# Patient Record
Sex: Male | Born: 1995
Health system: Southern US, Community
[De-identification: ages and names within clinical notes are randomized; demographics above are authoritative.]

## PROBLEM LIST (undated history)

## (undated) DIAGNOSIS — J189 Pneumonia, unspecified organism: Secondary | ICD-10-CM

## (undated) DIAGNOSIS — R519 Headache, unspecified: Secondary | ICD-10-CM

## (undated) DIAGNOSIS — S46011D Strain of muscle(s) and tendon(s) of the rotator cuff of right shoulder, subsequent encounter: Secondary | ICD-10-CM

## (undated) DIAGNOSIS — T8859XA Other complications of anesthesia, initial encounter: Secondary | ICD-10-CM

## (undated) HISTORY — PX: HAND SURGERY: SHX662

## (undated) HISTORY — PX: OTHER SURGICAL HISTORY: SHX169

---
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
## 2015-05-21 ENCOUNTER — Emergency Department (HOSPITAL_BASED_OUTPATIENT_CLINIC_OR_DEPARTMENT_OTHER)
Admission: EM | Admit: 2015-05-21 | Discharge: 2015-05-21 | Disposition: A | Discharge status: Home / Self Care | Payer: Medicaid Other | Attending: Emergency Medicine | Admitting: Emergency Medicine

## 2015-05-21 ENCOUNTER — Encounter (HOSPITAL_BASED_OUTPATIENT_CLINIC_OR_DEPARTMENT_OTHER): Payer: Self-pay | Admitting: Emergency Medicine

## 2015-05-21 DIAGNOSIS — Z87891 Personal history of nicotine dependence: Secondary | ICD-10-CM | POA: Diagnosis not present

## 2015-05-21 DIAGNOSIS — L0201 Cutaneous abscess of face: Secondary | ICD-10-CM

## 2015-05-21 MED ORDER — SULFAMETHOXAZOLE-TRIMETHOPRIM 800-160 MG PO TABS: 1.0000 | ORAL_TABLET | Freq: Two times a day (BID) | ORAL | Status: DC

## 2015-05-21 MED ORDER — IBUPROFEN 400 MG PO TABS
400.0000 mg | ORAL_TABLET | Freq: Once | ORAL | Status: AC
  Administered 2015-05-21: 400 mg via ORAL
  Filled 2015-05-21: qty 1

## 2015-05-21 MED ORDER — SULFAMETHOXAZOLE-TRIMETHOPRIM 800-160 MG PO TABS
1.0000 | ORAL_TABLET | Freq: Once | ORAL | Status: AC
  Administered 2015-05-21: 1 via ORAL
  Filled 2015-05-21: qty 1

## 2015-05-21 NOTE — ED Notes (Signed)
Pt in with abscess noted to R face lateral to eye. Closed, red, and raised.

## 2015-05-21 NOTE — Discharge Instructions (Signed)
For pain control please take ibuprofen (also known as Motrin or Advil) 800mg  (this is normally 4 over the counter pills) 3 times a day  for 5 days. Take with food to minimize stomach irritation.  Apply warm compresses to the area as frequently as you can minimally 4 times per day. Do not scratch at the area. If the swelling worsens or travels towards the eye or you have any change in your vision return immediately to the emergency room. If not please follow-up with your primary care doctor in 1 week for a checkup.   Abscess An abscess is an infected area that contains a collection of pus and debris.It can occur in almost any part of the body. An abscess is also known as a furuncle or boil. CAUSES  An abscess occurs when tissue gets infected. This can occur from blockage of oil or sweat glands, infection of hair follicles, or a minor injury to the skin. As the body tries to fight the infection, pus collects in the area and creates pressure under the skin. This pressure causes pain. People with weakened immune systems have difficulty fighting infections and get certain abscesses more often.  SYMPTOMS Usually an abscess develops on the skin and becomes a painful mass that is red, warm, and tender. If the abscess forms under the skin, you may feel a moveable soft area under the skin. Some abscesses break open (rupture) on their own, but most will continue to get worse without care. The infection can spread deeper into the body and eventually into the bloodstream, causing you to feel ill.  DIAGNOSIS  Your caregiver will take your medical history and perform a physical exam. A sample of fluid may also be taken from the abscess to determine what is causing your infection. TREATMENT  Your caregiver may prescribe antibiotic medicines to fight the infection. However, taking antibiotics alone usually does not cure an abscess. Your caregiver may need to make a small cut (incision) in the abscess to drain the pus.  In some cases, gauze is packed into the abscess to reduce pain and to continue draining the area. HOME CARE INSTRUCTIONS   Only take over-the-counter or prescription medicines for pain, discomfort, or fever as directed by your caregiver.  If you were prescribed antibiotics, take them as directed. Finish them even if you start to feel better.  If gauze is used, follow your caregiver's directions for changing the gauze.  To avoid spreading the infection:  Keep your draining abscess covered with a bandage.  Wash your hands well.  Do not share personal care items, towels, or whirlpools with others.  Avoid skin contact with others.  Keep your skin and clothes clean around the abscess.  Keep all follow-up appointments as directed by your caregiver. SEEK MEDICAL CARE IF:   You have increased pain, swelling, redness, fluid drainage, or bleeding.  You have muscle aches, chills, or a general ill feeling.  You have a fever. MAKE SURE YOU:   Understand these instructions.  Will watch your condition.  Will get help right away if you are not doing well or get worse. Document Released: 08/07/2005 Document Revised: 04/28/2012 Document Reviewed: 01/10/2012 [facility] Patient Information 2015 [place], [place]. This information is not intended to replace advice given to you by your health care provider. Make sure you discuss any questions you have with your health care provider.

## 2015-05-21 NOTE — ED Provider Notes (Signed)
CSN: [id number]     Arrival date & time 05/21/15  2150 History   First MD Initiated Contact with Patient 05/21/15 2219     Chief Complaint  Patient presents with  . Abscess     (Consider location/radiation/quality/duration/timing/severity/associated sxs/prior Treatment) HPI   Blood pressure 138/85, pulse 91, temperature 97.9 F (36.6 C), temperature source Oral, resp. rate 18, height 6\' 6"  (1.981 m), weight 270 lb (122.471 kg), SpO2 97 %.  Nicholas Jensen is a 19 y.o. male complaining of painful swelling to right cheek worsening over the course of the day. Patient initially had a pimple to her yesterday, he picked at it became significantly larger today. Patient denies any fever, nausea, vomiting, change in vision, pain with eye movement.  History reviewed. No pertinent past medical history. Past Surgical History  Procedure Laterality Date  . Hand surgery      right   History reviewed. No pertinent family history. History  Substance Use Topics  . Smoking status: Former Games developermoker  . Smokeless tobacco: Not on file  . Alcohol Use: No    Review of Systems  10 systems reviewed and found to be negative, except as noted in the HPI.   Allergies  Review of patient's allergies indicates no known allergies.  Home Medications   Prior to Admission medications   Medication Sig Start Date End Date Taking? Authorizing Provider  ibuprofen (ADVIL,MOTRIN) 200 MG tablet Take 200 mg by mouth every 6 (six) hours as needed. For headache    Historical Provider, MD  sulfamethoxazole-trimethoprim (BACTRIM DS) 800-160 MG per tablet Take 1 tablet by mouth 2 (two) times daily. 05/21/15   Yuko Coventry, PA-C   BP 138/85 mmHg  Pulse 91  Temp(Src) 97.9 F (36.6 C) (Oral)  Resp 18  Ht 6\' 6"  (1.981 m)  Wt 270 lb (122.471 kg)  BMI 31.21 kg/m2  SpO2 97% Physical Exam  Constitutional: He is oriented to person, place, and time. He appears well-developed and well-nourished. No distress.  HENT:  Head:  Normocephalic.    Eyes: Conjunctivae and EOM are normal. Pupils are equal, round, and reactive to light.  Extraocular movement is intact without pain or diplopia  Cardiovascular: Normal rate.   Pulmonary/Chest: Effort normal. No stridor.  Musculoskeletal: Normal range of motion.  Neurological: He is alert and oriented to person, place, and time.  Skin: Rash noted.  Psychiatric: He has a normal mood and affect.  Nursing note and vitals reviewed.   ED Course  Procedures (including critical care time) Labs Review Labs Reviewed - No data to display  Imaging Review No results found.   EKG Interpretation None      MDM   Final diagnoses:  Facial abscess    Filed Vitals:   05/21/15 2155  BP: 138/85  Pulse: 91  Temp: 97.9 F (36.6 C)  TempSrc: Oral  Resp: 18  Height: 6\' 6"  (1.981 m)  Weight: 270 lb (122.471 kg)  SpO2: 97%    Medications  sulfamethoxazole-trimethoprim (BACTRIM DS,SEPTRA DS) 800-160 MG per tablet 1 tablet (not administered)  ibuprofen (ADVIL,MOTRIN) tablet 400 mg (not administered)    Nicholas Jensen is a pleasant 19 y.o. male presenting with facial abscess. Do not want to I and D this secondary to the scar will leave. I will start the patient on Bactrim and have advised him to perform frequent warm compresses.  Evaluation does not show pathology that would require ongoing emergent intervention or inpatient treatment. Pt is hemodynamically stable and mentating appropriately.  Discussed findings and plan with patient/guardian, who agrees with care plan. All questions answered. Return precautions discussed and outpatient follow up given.   New Prescriptions   SULFAMETHOXAZOLE-TRIMETHOPRIM (BACTRIM DS) 800-160 MG PER TABLET    Take 1 tablet by mouth 2 (two) times daily.         Wynetta Emery, PA-C 05/21/15 2230  Margarita Grizzle, MD 05/21/15 2251

## 2015-07-30 ENCOUNTER — Emergency Department (HOSPITAL_BASED_OUTPATIENT_CLINIC_OR_DEPARTMENT_OTHER)
Admission: EM | Admit: 2015-07-30 | Discharge: 2015-07-30 | Disposition: A | Discharge status: Home / Self Care | Payer: Medicaid Other | Attending: Emergency Medicine | Admitting: Emergency Medicine

## 2015-07-30 ENCOUNTER — Encounter (HOSPITAL_BASED_OUTPATIENT_CLINIC_OR_DEPARTMENT_OTHER): Payer: Self-pay | Admitting: *Deleted

## 2015-07-30 DIAGNOSIS — L02411 Cutaneous abscess of right axilla: Secondary | ICD-10-CM | POA: Diagnosis present

## 2015-07-30 DIAGNOSIS — R202 Paresthesia of skin: Secondary | ICD-10-CM | POA: Insufficient documentation

## 2015-07-30 DIAGNOSIS — Z87891 Personal history of nicotine dependence: Secondary | ICD-10-CM | POA: Insufficient documentation

## 2015-07-30 DIAGNOSIS — Z792 Long term (current) use of antibiotics: Secondary | ICD-10-CM | POA: Insufficient documentation

## 2015-07-30 MED ORDER — LIDOCAINE-EPINEPHRINE 2 %-1:100000 IJ SOLN
20.0000 mL | Freq: Once | INTRAMUSCULAR | Status: AC
  Administered 2015-07-30: 15 mL
  Filled 2015-07-30: qty 1

## 2015-07-30 MED ORDER — DOXYCYCLINE HYCLATE 100 MG PO TABS
100.0000 mg | ORAL_TABLET | Freq: Once | ORAL | Status: AC
  Administered 2015-07-30: 100 mg via ORAL
  Filled 2015-07-30: qty 1

## 2015-07-30 MED ORDER — HYDROCODONE-ACETAMINOPHEN 5-325 MG PO TABS
1.0000 | ORAL_TABLET | Freq: Once | ORAL | Status: AC
  Administered 2015-07-30: 1 via ORAL
  Filled 2015-07-30: qty 1

## 2015-07-30 MED ORDER — HYDROCODONE-ACETAMINOPHEN 5-325 MG PO TABS: 1.0000 | ORAL_TABLET | Freq: Four times a day (QID) | ORAL | Status: DC | PRN

## 2015-07-30 MED ORDER — DOXYCYCLINE HYCLATE 100 MG PO CAPS: 100.0000 mg | ORAL_CAPSULE | Freq: Two times a day (BID) | ORAL | Status: DC

## 2015-07-30 NOTE — ED Notes (Signed)
Pt c/o of abscess to right axillary area for one week. States area was draining earlier this week, but now states area is hard to touch. Denies any fevers. C/o tingling to right arm at times this week.

## 2015-07-30 NOTE — Discharge Instructions (Signed)

## 2015-07-30 NOTE — ED Provider Notes (Addendum)
CSN: [id number]     Arrival date & time 07/30/15  0135 History   First MD Initiated Contact with Patient 07/30/15 0355     Chief Complaint  Patient presents with  . Abscess     (Consider location/radiation/quality/duration/timing/severity/associated sxs/prior Treatment) HPI  This is a 19 year old male with a tender swollen mass in his right axilla for the past week. It was draining purulent material earlier in the week but has stopped. It is increasing in size and there is moderate to severe pain associated with it, worse with palpation or movement. He denies any fever or chills. He has had some tingling in his right arm at times but none presently.  History reviewed. No pertinent past medical history. Past Surgical History  Procedure Laterality Date  . Hand surgery      right  . Hand surgery     No family history on file. Social History  Substance Use Topics  . Smoking status: Former Games developer  . Smokeless tobacco: None  . Alcohol Use: No    Review of Systems  All other systems reviewed and are negative.   Allergies  Review of patient's allergies indicates no known allergies.  Home Medications   Prior to Admission medications   Medication Sig Start Date End Date Taking? Authorizing Provider  ibuprofen (ADVIL,MOTRIN) 200 MG tablet Take 200 mg by mouth every 6 (six) hours as needed. For headache    Historical Provider, MD  sulfamethoxazole-trimethoprim (BACTRIM DS) 800-160 MG per tablet Take 1 tablet by mouth 2 (two) times daily. 05/21/15   Nicole Pisciotta, PA-C   BP 145/77 mmHg  Pulse 102  Temp(Src) 98.6 F (37 C)  Resp 20  Ht  (1.981 m)  Wt 260 lb (117.935 kg)  BMI 30.05 kg/m2  SpO2 99%   Physical Exam  General: Well-developed, well-nourished male in no acute distress; appearance consistent with age of record HENT: normocephalic; atraumatic Eyes: Normal appearance Neck: supple Heart: regular rate and rhythm Lungs: Normal respiratory effort and  excursion Abdomen: soft; nondistended Extremities: No deformity; full range of motion Neurologic: Awake, alert and oriented; motor function intact in all extremities and symmetric; no facial droop Skin: Warm and dry Psychiatric: Normal mood and affect    ED Course  Procedures (including critical care time)  INCISION AND DRAINAGE Performed by: Paula Libra L Consent: Verbal consent obtained. Risks and benefits: risks, benefits and alternatives were discussed Type: abscess  Body area: Right axilla  Anesthesia: local infiltration  Incision was made with a scalpel.  Local anesthetic: lidocaine 2 % with epinephrine  Anesthetic total: 3 ml  Complexity: complex Blunt dissection to break up loculations  Drainage: purulent  Drainage amount: Moderate   Packing material: 1/4 in iodoform gauze  Patient tolerance: Patient tolerated the procedure well with no immediate complications.     MDM  We will place on antibiotics due to surrounding cellulitis.   Paula Libra, MD 07/30/15 4098  Paula Libra, MD 07/30/15 [phone]

## 2015-07-31 ENCOUNTER — Emergency Department (HOSPITAL_BASED_OUTPATIENT_CLINIC_OR_DEPARTMENT_OTHER)
Admission: EM | Admit: 2015-07-31 | Discharge: 2015-07-31 | Disposition: A | Discharge status: Home / Self Care | Payer: Medicaid Other | Attending: Emergency Medicine | Admitting: Emergency Medicine

## 2015-07-31 ENCOUNTER — Encounter (HOSPITAL_BASED_OUTPATIENT_CLINIC_OR_DEPARTMENT_OTHER): Payer: Self-pay

## 2015-07-31 DIAGNOSIS — Z4800 Encounter for change or removal of nonsurgical wound dressing: Secondary | ICD-10-CM

## 2015-07-31 DIAGNOSIS — Z4801 Encounter for change or removal of surgical wound dressing: Secondary | ICD-10-CM | POA: Diagnosis present

## 2015-07-31 DIAGNOSIS — Z87891 Personal history of nicotine dependence: Secondary | ICD-10-CM | POA: Insufficient documentation

## 2015-07-31 NOTE — ED Notes (Signed)
I&D 2 days ago-here for packing removal

## 2015-07-31 NOTE — Discharge Instructions (Signed)
Dressing Change A dressing is a material placed over wounds. It keeps the wound clean, dry, and protected from further injury. This provides an environment that favors wound healing.  BEFORE YOU BEGIN  Get your supplies together. Things you may need include:  Saline solution.  Flexible gauze dressing.  Medicated cream.  Tape.  Gloves.  Abdominal dressing pads.  Gauze squares.  Plastic bags.  Take pain medicine 30 minutes before the dressing change if you need it.  Take a shower before you do the first dressing change of the day. Use plastic wrap or a plastic bag to prevent the dressing from getting wet. REMOVING YOUR OLD DRESSING   Wash your hands with soap and water. Dry your hands with a clean towel.  Put on your gloves.  Remove any tape.  Carefully remove the old dressing. If the dressing sticks, you may dampen it with warm water to loosen it, or follow your caregiver's specific directions.  Remove any gauze or packing tape that is in your wound.  Take off your gloves.  Put the gloves, tape, gauze, or any packing tape into a plastic bag. CHANGING YOUR DRESSING  Open the supplies.  Take the cap off the saline solution.  Open the gauze package so that the gauze remains on the inside of the package.  Put on your gloves.  Clean your wound as told by your caregiver.  If you have been told to keep your wound dry, follow those instructions.  Your caregiver may tell you to do one or more of the following:  Pick up the gauze. Pour the saline solution over the gauze. Squeeze out the extra saline solution.  Put medicated cream or other medicine on your wound if you have been told to do so.  Put the solution soaked gauze only in your wound, not on the skin around it.  Pack your wound loosely or as told by your caregiver.  Put dry gauze on your wound.  Put abdominal dressing pads over the dry gauze if your wet gauze soaks through.  Tape the abdominal dressing  pads in place so they will not fall off. Do not wrap the tape completely around the affected part (arm, leg, abdomen).  Wrap the dressing pads with a flexible gauze dressing to secure it in place.  Take off your gloves. Put them in the plastic bag with the old dressing. Tie the bag shut and throw it away.  Keep the dressing clean and dry until your next dressing change.  Wash your hands. SEEK MEDICAL CARE IF:  Your skin around the wound looks red.  Your wound feels more tender or sore.  You see pus in the wound.  Your wound smells bad.  You have a fever.  Your skin around the wound has a rash that itches and burns.  You see black or yellow skin in your wound that was not there before.  You feel nauseous, throw up, and feel very tired. Document Released: 12/05/2004 Document Revised: 01/20/2012 Document Reviewed: 09/09/2011 [facility] Patient Information 2015 [place], [place]. This information is not intended to replace advice given to you by your health care provider. Make sure you discuss any questions you have with your health care provider.  Wound Check Your wound appears healthy today. Your wound will heal gradually over time. Eventually a scar will form that will fade with time. FACTORS THAT AFFECT SCAR FORMATION:  People differ in the severity in which they scar.  Scar severity varies according to  location, size, and the traits you inherited from your parents (genetic predisposition). °· Irritation to the wound from infection, rubbing, or chemical exposure will increase the amount of scar formation. °HOME CARE INSTRUCTIONS  °· If you were given a dressing, you should change it at least once a day or as instructed by your caregiver. If the bandage sticks, soak it off with a solution of hydrogen peroxide. °· If the bandage becomes wet, dirty, or develops a bad smell, change it as soon as possible. °· Look for signs of infection. °· Only take over-the-counter or prescription  medicines for pain, discomfort, or fever as directed by your caregiver. °SEEK IMMEDIATE MEDICAL CARE IF:  °· You have redness, swelling, or increasing pain in the wound. °· You notice pus coming from the wound. °· You have a fever. °· You notice a bad smell coming from the wound or dressing. °Document Released: 08/03/2004 Document Revised: 01/20/2012 Document Reviewed: 10/28/2005 °ExitCare® Patient Information ©2015 [employer]. This information is not intended to replace advice given to you by your health care provider. Make sure you discuss any questions you have with your health care provider. ° °

## 2015-07-31 NOTE — ED Notes (Signed)
PA at bedside.

## 2015-07-31 NOTE — ED Provider Notes (Signed)
CSN: [id number]     Arrival date & time 07/31/15  1345 History   First MD Initiated Contact with Patient 07/31/15 1359     Chief Complaint  Patient presents with  . Wound Check     (Consider location/radiation/quality/duration/timing/severity/associated sxs/prior Treatment) HPI Comments: 19 year old male presenting for packing removal from an abscess that was packed yesterday early morning. Denies having any problems with the area unless he touches it he gets a small amount of pain. Does not believe it is been draining. His mom has been changing the dressing. No fevers. No worsening symptoms.  Patient is a 19 y.o. male presenting with wound check. The history is provided by the patient.  Wound Check This is a new problem. The current episode started in the past 7 days. The problem occurs rarely. The problem has been gradually improving. Pertinent negatives include no fever. Nothing aggravates the symptoms. Treatments tried: I&D. The treatment provided significant relief.    History reviewed. No pertinent past medical history. Past Surgical History  Procedure Laterality Date  . Hand surgery      right  . Hand surgery     No family history on file. Social History  Substance Use Topics  . Smoking status: Former Games developer  . Smokeless tobacco: None  . Alcohol Use: No    Review of Systems  Constitutional: Negative for fever.  Skin:       + Abscess.      Allergies  Review of patient's allergies indicates no known allergies.  Home Medications   Prior to Admission medications   Medication Sig Start Date End Date Taking? Authorizing Deana Krock  doxycycline (VIBRAMYCIN) 100 MG capsule Take 1 capsule (100 mg total) by mouth 2 (two) times daily. One po bid x 7 days 07/30/15   Paula Libra, MD  HYDROcodone-acetaminophen (NORCO) 5-325 MG per tablet Take 1-2 tablets by mouth every 6 (six) hours as needed (for pain). 07/30/15   John Molpus, MD   BP 128/75 mmHg  Pulse 81  Temp(Src) 97.3 F  (36.3 C) (Oral)  Resp 18  Ht  (1.981 m)  Wt 260 lb (117.935 kg)  BMI 30.05 kg/m2  SpO2 99% Physical Exam  Constitutional: He is oriented to person, place, and time. He appears well-developed and well-nourished. No distress.  HENT:  Head: Normocephalic and atraumatic.  Eyes: Conjunctivae and EOM are normal.  Neck: Normal range of motion. Neck supple.  Cardiovascular: Normal rate, regular rhythm and normal heart sounds.   Pulmonary/Chest: Effort normal and breath sounds normal.  Musculoskeletal: Normal range of motion. He exhibits no edema.  Neurological: He is alert and oriented to person, place, and time.  Skin: Skin is warm and dry.  Well healing 1 cm incision of R axilla. Minimal induration around incision. No drainage. No erythema or warmth.  Psychiatric: He has a normal mood and affect. His behavior is normal.  Nursing note and vitals reviewed.   ED Course  Procedures (including critical care time)  Labs Review Labs Reviewed - No data to display  Imaging Review No results found. I have personally reviewed and evaluated these images and lab results as part of my medical decision-making.   EKG Interpretation None      MDM   Final diagnoses:  Abscess packing removal   Abscess appears to be healing well. Packing removed. No need for further drainage or repacking. Advise follow-up with PCP in one week for recheck. Stable for discharge. Return precautions given. Patient states understanding of treatment  care plan and is agreeable.  Kathrynn Speed, PA-C 07/31/15 1406  Benjiman Core, MD 08/01/15 [phone]

## 2016-05-20 ENCOUNTER — Encounter (HOSPITAL_COMMUNITY): Payer: Self-pay | Admitting: *Deleted

## 2016-05-20 ENCOUNTER — Emergency Department (HOSPITAL_COMMUNITY)
Admission: EM | Admit: 2016-05-20 | Discharge: 2016-05-20 | Disposition: A | Discharge status: Home / Self Care | Payer: Medicaid Other | Attending: Emergency Medicine | Admitting: Emergency Medicine

## 2016-05-20 DIAGNOSIS — Z87891 Personal history of nicotine dependence: Secondary | ICD-10-CM | POA: Diagnosis not present

## 2016-05-20 DIAGNOSIS — K0889 Other specified disorders of teeth and supporting structures: Secondary | ICD-10-CM | POA: Insufficient documentation

## 2016-05-20 MED ORDER — PENICILLIN V POTASSIUM 500 MG PO TABS: 500.0000 mg | ORAL_TABLET | Freq: Four times a day (QID) | ORAL | Status: AC

## 2016-05-20 MED ORDER — BUPIVACAINE-EPINEPHRINE (PF) 0.5% -1:200000 IJ SOLN
1.8000 mL | Freq: Once | INTRAMUSCULAR | Status: DC
  Filled 2016-05-20: qty 1.8

## 2016-05-20 MED ORDER — HYDROCODONE-ACETAMINOPHEN 5-325 MG PO TABS: 1.0000 | ORAL_TABLET | Freq: Four times a day (QID) | ORAL | Status: DC | PRN

## 2016-05-20 NOTE — Discharge Instructions (Signed)

## 2016-05-20 NOTE — ED Provider Notes (Signed)
CSN: [id number]     Arrival date & time 05/20/16  2021 History   First MD Initiated Contact with Patient 05/20/16 2054     Chief Complaint  Patient presents with  . Dental Pain   Patient is a 20 y.o. male presenting with tooth pain.  Dental Pain Location:  Upper and lower Upper teeth location:  2/RU 2nd molar Lower teeth location:  29/RL 2nd bicuspid Quality:  Aching Severity:  Severe Onset quality:  Gradual Duration:  4 days Timing:  Constant Progression:  Worsening Chronicity:  New Context: dental caries   Previous work-up:  Dental exam Relieved by:  Ice Worsened by:  Hot food/drink, touching and jaw movement Associated symptoms: no difficulty swallowing, no drooling, no facial pain, no facial swelling, no fever, no neck pain, no neck swelling and no trismus   Risk factors: periodontal disease    20 year old male presenting with right-sided upper and lower dental pain. Patient states he has had intermittent dental pain over the past few months with acute worsening over the past few days. He has history of poor dental care and has a Over his right upper second molar which is the tooth currently in pain. He states that chewing and biting his teeth together exacerbates the pain. Swishing with ice water alleviates the pain somewhat. Over-the-counter pain medications provided no relief. He has a dentist appointment scheduled in 2 days for evaluation of his dental pain. Denies fevers, chills, facial swelling, facial pain, inability to handle secretions, difficulty breathing, neck pain, neck swelling, nausea or vomiting.  History reviewed. No pertinent past medical history. Past Surgical History  Procedure Laterality Date  . Hand surgery      right  . Hand surgery     No family history on file. Social History  Substance Use Topics  . Smoking status: Former Games developer  . Smokeless tobacco: None  . Alcohol Use: No    Review of Systems  Constitutional: Negative for fever.  HENT:  Positive for dental problem. Negative for drooling and facial swelling.   Musculoskeletal: Negative for neck pain.      Allergies  Review of patient's allergies indicates no known allergies.  Home Medications   Prior to Admission medications   Medication Sig Start Date End Date Taking? Authorizing Provider  doxycycline (VIBRAMYCIN) 100 MG capsule Take 1 capsule (100 mg total) by mouth 2 (two) times daily. One po bid x 7 days 07/30/15   Paula Libra, MD  HYDROcodone-acetaminophen (NORCO) 5-325 MG per tablet Take 1-2 tablets by mouth every 6 (six) hours as needed (for pain). 07/30/15   John Molpus, MD  HYDROcodone-acetaminophen (NORCO/VICODIN) 5-325 MG tablet Take 1 tablet by mouth every 6 (six) hours as needed. 05/20/16   Jurell Basista, PA-C  penicillin v potassium (VEETID) 500 MG tablet Take 1 tablet (500 mg total) by mouth 4 (four) times daily. 05/20/16 05/27/16  Samira Acero, PA-C   BP 158/117 mmHg  Pulse 81  Resp 18  Ht  (1.981 m)  Wt 124.739 kg  BMI 31.79 kg/m2  SpO2 100% Physical Exam  Constitutional: He appears well-developed and well-nourished. No distress.  Repeat BP 135/82  HENT:  Head: Normocephalic and atraumatic.  Right Ear: External ear normal.  Left Ear: External ear normal.  Mouth/Throat: Uvula is midline and oropharynx is clear and moist. Mucous membranes are not dry. No trismus in the jaw. No dental abscesses or uvula swelling.    Tenderness to palpation of the upper second molar and lower second  bicuspid. No erythema or drainage from the surrounding gumline. No obvious dental abscess. No trismus. No erythema or soft tissue swelling of the cheek.   Eyes: Conjunctivae are normal. Right eye exhibits no discharge. Left eye exhibits no discharge. No scleral icterus.  Neck: Normal range of motion. Neck supple.  FROM of neck intact without pain. No TTP. No cervical adenopathy. No soft tissue swelling of the neck.   Cardiovascular: Normal rate.   Pulmonary/Chest:  Effort normal. No stridor. No respiratory distress.  Musculoskeletal: Normal range of motion.  Lymphadenopathy:    He has no cervical adenopathy.  Neurological: He is alert. Coordination normal.  Skin: Skin is warm and dry.  Psychiatric: He has a normal mood and affect. His behavior is normal.  Nursing note and vitals reviewed.   ED Course  .Nerve Block Date/Time: 05/20/2016 9:42 PM Performed by: Alveta HeimlichBARRETT, Ardelia Wrede Authorized by: Alveta HeimlichBARRETT, Tekeisha Hakim Consent: Verbal consent obtained. Risks and benefits: risks, benefits and alternatives were discussed Consent given by: patient Patient understanding: patient states understanding of the procedure being performed Patient consent: the patient's understanding of the procedure matches consent given Procedure consent: procedure consent matches procedure scheduled Required items: required blood products, implants, devices, and special equipment available Patient identity confirmed: verbally with patient Indications: pain relief Body area: face/mouth Nerve: inferior alveolar Laterality: left Patient sedated: no Patient position: sitting Needle gauge: 27 G Location technique: anatomical landmarks Local anesthetic: bupivacaine 0.5% with epinephrine Anesthetic total: 1.8 ml Outcome: pain improved Patient tolerance: Patient tolerated the procedure well with no immediate complications   (including critical care time) Labs Review Labs Reviewed - No data to display  Imaging Review No results found. I have personally reviewed and evaluated these images and lab results as part of my medical decision-making.   EKG Interpretation None      MDM   Final diagnoses:  Pain, dental   Patient presenting with tooth pain. No obvious abscess on exam. No soft tissue swelling of the cheek or neck. Exam unconcerning for Ludwig's angina or spread of infection.  Pain controlled in ED with dental block. Will discharge with penicillin and 2 day course of pain  rx. Dentist appointment already scheduled in 2 days. Return precautions discussed with pt and given in discharge paperwork. Stable for discharge.      Rolm GalaStevi Analleli Gierke, PA-C 05/20/16 2150  Maia PlanJoshua G Long, MD 05/20/16 2337

## 2016-05-20 NOTE — ED Notes (Signed)
Pt complains of upper and lower molar pain for the past couple of days. Pt states he has dentist appointment of Wednesday but cannot take the pain any longer.

## 2017-01-25 ENCOUNTER — Encounter (HOSPITAL_BASED_OUTPATIENT_CLINIC_OR_DEPARTMENT_OTHER): Payer: Self-pay | Admitting: Emergency Medicine

## 2017-01-25 ENCOUNTER — Emergency Department (HOSPITAL_BASED_OUTPATIENT_CLINIC_OR_DEPARTMENT_OTHER): Payer: Medicaid Other

## 2017-01-25 ENCOUNTER — Emergency Department (HOSPITAL_BASED_OUTPATIENT_CLINIC_OR_DEPARTMENT_OTHER)
Admission: EM | Admit: 2017-01-25 | Discharge: 2017-01-25 | Disposition: A | Discharge status: Home / Self Care | Payer: Medicaid Other | Attending: Emergency Medicine | Admitting: Emergency Medicine

## 2017-01-25 DIAGNOSIS — Y9389 Activity, other specified: Secondary | ICD-10-CM | POA: Diagnosis not present

## 2017-01-25 DIAGNOSIS — Y990 Civilian activity done for income or pay: Secondary | ICD-10-CM | POA: Insufficient documentation

## 2017-01-25 DIAGNOSIS — Y929 Unspecified place or not applicable: Secondary | ICD-10-CM | POA: Insufficient documentation

## 2017-01-25 DIAGNOSIS — M25572 Pain in left ankle and joints of left foot: Secondary | ICD-10-CM | POA: Insufficient documentation

## 2017-01-25 DIAGNOSIS — S8992XA Unspecified injury of left lower leg, initial encounter: Secondary | ICD-10-CM | POA: Diagnosis present

## 2017-01-25 DIAGNOSIS — Z79891 Long term (current) use of opiate analgesic: Secondary | ICD-10-CM | POA: Diagnosis not present

## 2017-01-25 DIAGNOSIS — W1840XA Slipping, tripping and stumbling without falling, unspecified, initial encounter: Secondary | ICD-10-CM | POA: Diagnosis not present

## 2017-01-25 MED ORDER — NAPROXEN 500 MG PO TABS: 500.0000 mg | ORAL_TABLET | Freq: Two times a day (BID) | ORAL | 0 refills | Status: DC

## 2017-01-25 NOTE — ED Notes (Signed)
EDP into room, prior to RN assessment, see MD notes, orders received to splint and d/c. Care assumed at time of d/c. [facility]

## 2017-01-25 NOTE — ED Triage Notes (Signed)
L ankle pain x 1 month. States it is worse today and he is having difficulty walking. Denies injury.

## 2017-01-25 NOTE — ED Notes (Signed)
Alert, NAD, calm, interactive, resps e/u, speaking in clear complete sentences, no dyspnea noted, skin W&D, c/o ankle pain and ready to go, (denies: other sx). [facility] Denies injury, " pain onging for 1 month". Pt splinted prior to RN assessment, "feel better with splint".

## 2017-01-25 NOTE — ED Provider Notes (Signed)
[facility]   CSN: [id number] Arrival date & time: 01/25/17  1847  By signing my name below, I, Linna Darner, attest that this documentation has been prepared under the direction and in the presence of physician practitioner, Tilden Fossa, MD. Electronically Signed: Linna Darner, Scribe. 01/25/2017. 9:56 PM.  History   Chief Complaint Chief Complaint  Patient presents with  . Ankle Pain    The history is provided by the patient. No language interpreter was used.     HPI Comments: Nicholas Jensen is a 21 y.o. male with no chronic medical problems who presents to the Emergency Department complaining of constant left ankle pain for about one month. He reports associated swelling. He notes his left ankle feels like it is "tearing" occasionally. Pt states he woke up one morning about a month ago with severe left ankle pain but denies known trauma or injuries to his left ankle. He works at [employer] and is on his feet often throughout his shifts which he believes is contributing to the persistence of his symptoms. Pt has tried ibuprofen with minimal improvement of his pain and swelling. He states he tripped earlier at work today which exacerbated his left ankle pain; no falls or LOC. He denies fever, chills, chest pain, SOB, nausea, vomiting, wounds, color change, or any other associated symptoms. No PCP.  History reviewed. No pertinent past medical history.  Patient Active Problem List   Diagnosis Date Noted  . Right shoulder injury 05/15/2012    Past Surgical History:  Procedure Laterality Date  . HAND SURGERY     right  . HAND SURGERY         Home Medications    Prior to Admission medications   Medication Sig Start Date End Date Taking? Authorizing Provider  doxycycline (VIBRAMYCIN) 100 MG capsule Take 1 capsule (100 mg total) by mouth 2 (two) times daily. One po bid x 7 days 07/30/15   Paula Libra, MD  HYDROcodone-acetaminophen (NORCO) 5-325 MG  per tablet Take 1-2 tablets by mouth every 6 (six) hours as needed (for pain). 07/30/15   John Molpus, MD  HYDROcodone-acetaminophen (NORCO/VICODIN) 5-325 MG tablet Take 1 tablet by mouth every 6 (six) hours as needed. 05/20/16   Stevi Barrett, PA-C  naproxen (NAPROSYN) 500 MG tablet Take 1 tablet (500 mg total) by mouth 2 (two) times daily. 01/25/17   Tilden Fossa, MD    Family History No family history on file.  Social History Social History  Substance Use Topics  . Smoking status: Former Games developer  . Smokeless tobacco: Never Used  . Alcohol use No     Allergies   Patient has no known allergies.   Review of Systems Review of Systems  Constitutional: Negative for chills and fever.  Respiratory: Negative for shortness of breath.   Cardiovascular: Negative for chest pain.  Gastrointestinal: Negative for nausea and vomiting.  Musculoskeletal: Positive for arthralgias and joint swelling.  Skin: Negative for color change and wound.  Neurological: Negative for syncope.  All other systems reviewed and are negative.   Physical Exam Updated Vital Signs BP 132/88 (BP Location: Right Arm)   Pulse 61   Temp 98 F (36.7 C) (Oral)   Resp 16   Ht 6\' 6"  (1.981 m)   Wt 266 lb (120.7 kg)   SpO2 99%   BMI 30.74 kg/m   Physical Exam  Constitutional: He is oriented to person, place, and time. He appears well-developed and well-nourished.  HENT:  Head: Normocephalic and atraumatic.  Cardiovascular: Normal rate and regular rhythm.   Pulses:      Dorsalis pedis pulses are 2+ on the right side, and 2+ on the left side.  Pulmonary/Chest: Effort normal. No respiratory distress.  Musculoskeletal: He exhibits edema and tenderness.  Mild edema to the left ankle with mild local tenderness. Pain on dorsiflexion.  Neurological: He is alert and oriented to person, place, and time.  Skin: Skin is warm and dry.  Psychiatric: He has a normal mood and affect. His behavior is normal.  Nursing note  and vitals reviewed.   ED Treatments / Results  Labs (all labs ordered are listed, but only abnormal results are displayed) Labs Reviewed - No data to display  EKG  EKG Interpretation None       Radiology Dg Ankle Complete Left  Result Date: 01/25/2017 CLINICAL DATA:  c/o left lateral ankle pain for several months with NKI, tonight at work pt states he got tripped and felt the pain EXAM: LEFT ANKLE COMPLETE - 3+ VIEW COMPARISON:  None. FINDINGS: Ankle mortise intact. The talar dome is normal. No malleolar fracture. The calcaneus is normal. Ovoid cortical lesion in the distal fibula measuring 13 mm there is favored a benign nonossifying fibroma. No periosteal reaction. IMPRESSION: 1.  No fracture or dislocation. 2. Benign nonossifying fibroma of the fibula. Electronically Signed   By: Genevive BiStewart  Edmunds M.D.   On: 01/25/2017 19:27    Procedures Procedures (including critical care time)  DIAGNOSTIC STUDIES: Oxygen Saturation is 100% on RA, normal by my interpretation.    COORDINATION OF CARE: 10:02 PM Discussed treatment plan with pt at bedside and pt agreed to plan.  Medications Ordered in ED Medications - No data to display   Initial Impression / Assessment and Plan / ED Course  I have reviewed the triage vital signs and the nursing notes.  Pertinent labs & imaging results that were available during my care of the patient were reviewed by me and considered in my medical decision making (see chart for details).    Pt here for evaluation of one month Of left ankle pain, no history of trauma. He is edema but no erythema. Presentation is not consistent with gouty arthritis or septic arthritis. Discussed with patient home care for arthralgia with rest, splint as needed for comfort, NSAIDs. Discussed importance of outpatient follow-up and return precautions.   Final Clinical Impressions(s) / ED Diagnoses   Final diagnoses:  Arthralgia of left ankle    New  Prescriptions Discharge Medication List as of 01/25/2017 10:03 PM    START taking these medications   Details  naproxen (NAPROSYN) 500 MG tablet Take 1 tablet (500 mg total) by mouth 2 (two) times daily., Starting Sat 01/25/2017, Print      I personally performed the services described in this documentation, which was scribed in my presence. The recorded information has been reviewed and is accurate.    Tilden FossaElizabeth Shiori Adcox, MD 01/25/17 2351

## 2017-02-12 ENCOUNTER — Ambulatory Visit (INDEPENDENT_AMBULATORY_CARE_PROVIDER_SITE_OTHER): Payer: Medicaid Other | Admitting: Family Medicine

## 2017-02-12 ENCOUNTER — Encounter: Payer: Self-pay | Admitting: Family Medicine

## 2017-02-12 DIAGNOSIS — M25572 Pain in left ankle and joints of left foot: Secondary | ICD-10-CM

## 2017-02-12 NOTE — Patient Instructions (Signed)
Your exam and x-rays are reassuring. This is consistent with sinus tarsi syndrome which can cause a small plica accounting for the feeling that it's going to snap. The most important part of treatment is regular arch support (something like dr. Jari Sportsman active series, spencos, superfeet). Avoiding flat shoes and barefoot walking is also key. Icing 15 minutes at a time 3-4 times a day as needed. Aleve 2 tabs twice a day with food OR ibuprofen  three times a day with food for pain and inflammation - typically take for 7-10 days then as needed. If not improving can consider MRI, custom orthotics. Follow up with me in 6 weeks for reevaluation.

## 2017-02-13 DIAGNOSIS — M25572 Pain in left ankle and joints of left foot: Secondary | ICD-10-CM

## 2017-02-13 HISTORY — DX: Pain in left ankle and joints of left foot: M25.572

## 2017-02-13 NOTE — Progress Notes (Signed)
PCP: No PCP Per Patient  Subjective:   HPI: Patient is a 21 y.o. male here for left ankle pain.  Patient reports he's had about 2 months now of anterolateral left ankle pain. No acute injury or trauma. States he woke up with the pain in this area. No swelling, bruising. No increase in activity level prior to this. Taking ibuprofen and aleve. Wearing an ASO. Pain worse by end of day (on feet a lot at work). Pain level up to 2/10, sharp. No skin changes, numbness.  No past medical history on file.  Current Outpatient Prescriptions on File Prior to Visit  Medication Sig Dispense Refill  . doxycycline (VIBRAMYCIN) 100 MG capsule Take 1 capsule (100 mg total) by mouth 2 (two) times daily. One po bid x 7 days 14 capsule 0  . HYDROcodone-acetaminophen (NORCO) 5-325 MG per tablet Take 1-2 tablets by mouth every 6 (six) hours as needed (for pain). 10 tablet 0  . HYDROcodone-acetaminophen (NORCO/VICODIN) 5-325 MG tablet Take 1 tablet by mouth every 6 (six) hours as needed. 8 tablet 0  . naproxen (NAPROSYN) 500 MG tablet Take 1 tablet (500 mg total) by mouth 2 (two) times daily. 14 tablet 0   No current facility-administered medications on file prior to visit.     Past Surgical History:  Procedure Laterality Date  . HAND SURGERY     right  . HAND SURGERY      No Known Allergies  Social History   Social History  . Marital status: Single    Spouse name: N/A  . Number of children: N/A  . Years of education: N/A   Occupational History  . Not on file.   Social History Main Topics  . Smoking status: Former Games developer  . Smokeless tobacco: Never Used  . Alcohol use No  . Drug use: No  . Sexual activity: Not on file   Other Topics Concern  . Not on file   Social History Narrative  . No narrative on file    No family history on file.  BP 136/89   Pulse 72   Ht  (1.981 m)   Wt 260 lb (117.9 kg)   BMI 30.05 kg/m   Review of Systems: See HPI above.     Objective:   Physical Exam:  Gen: NAD, comfortable in exam room  Left ankle/foot: Pes planus.  No other gross deformity, swelling, ecchymoses FROM with 5/5 strength all directions. TTP over sinus tarsi. Negative ant drawer and talar tilt.   Negative syndesmotic compression. Thompsons test negative. NV intact distally.  Right foot/ankle: FROM without pain.   Assessment & Plan:  1. 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  Stressed importance of regular arch support.  Continus ASO as well.  Avoid flat shoes, barefoot walking.  Icing, aleve or ibuprofen.  Consider MRI, custom orthotics if not improving.  F/u in 6 weeks.

## 2017-02-13 NOTE — Assessment & Plan Note (Signed)
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  Stressed importance of regular arch support.  Continus ASO as well.  Avoid flat shoes, barefoot walking.  Icing, aleve or ibuprofen.  Consider MRI, custom orthotics if not improving.  F/u in 6 weeks.

## 2017-03-14 ENCOUNTER — Encounter (HOSPITAL_COMMUNITY): Payer: Self-pay

## 2017-03-14 DIAGNOSIS — L299 Pruritus, unspecified: Secondary | ICD-10-CM

## 2017-03-14 DIAGNOSIS — Z5321 Procedure and treatment not carried out due to patient leaving prior to being seen by health care provider: Secondary | ICD-10-CM | POA: Insufficient documentation

## 2017-03-14 DIAGNOSIS — Z79899 Other long term (current) drug therapy: Secondary | ICD-10-CM | POA: Diagnosis not present

## 2017-03-14 DIAGNOSIS — L739 Follicular disorder, unspecified: Secondary | ICD-10-CM | POA: Diagnosis not present

## 2017-03-14 DIAGNOSIS — R21 Rash and other nonspecific skin eruption: Secondary | ICD-10-CM | POA: Diagnosis present

## 2017-03-14 DIAGNOSIS — Z87891 Personal history of nicotine dependence: Secondary | ICD-10-CM | POA: Diagnosis not present

## 2017-03-14 NOTE — ED Triage Notes (Signed)
Pt complaining of itching and rash to groin. Pt states recently spent night at friends house with scabies. Pt denies any visible bugs.

## 2017-03-15 ENCOUNTER — Emergency Department (HOSPITAL_BASED_OUTPATIENT_CLINIC_OR_DEPARTMENT_OTHER)
Admission: EM | Admit: 2017-03-15 | Discharge: 2017-03-15 | Disposition: A | Discharge status: Home / Self Care | Payer: Medicaid Other | Attending: Emergency Medicine | Admitting: Emergency Medicine

## 2017-03-15 ENCOUNTER — Encounter (HOSPITAL_BASED_OUTPATIENT_CLINIC_OR_DEPARTMENT_OTHER): Payer: Self-pay | Admitting: *Deleted

## 2017-03-15 ENCOUNTER — Emergency Department (HOSPITAL_COMMUNITY)
Admission: EM | Admit: 2017-03-15 | Discharge: 2017-03-15 | Disposition: A | Discharge status: Home / Self Care | Payer: Medicaid Other | Source: Home / Self Care

## 2017-03-15 DIAGNOSIS — Z79899 Other long term (current) drug therapy: Secondary | ICD-10-CM | POA: Insufficient documentation

## 2017-03-15 DIAGNOSIS — Z87891 Personal history of nicotine dependence: Secondary | ICD-10-CM | POA: Insufficient documentation

## 2017-03-15 DIAGNOSIS — L739 Follicular disorder, unspecified: Secondary | ICD-10-CM

## 2017-03-15 MED ORDER — SULFAMETHOXAZOLE-TRIMETHOPRIM 800-160 MG PO TABS: 1.0000 | ORAL_TABLET | Freq: Two times a day (BID) | ORAL | 0 refills | Status: AC

## 2017-03-15 MED ORDER — DIPHENHYDRAMINE HCL 25 MG PO CAPS: 25.0000 mg | ORAL_CAPSULE | Freq: Once | ORAL | Status: DC

## 2017-03-15 MED ORDER — PERMETHRIN 5 % EX CREA: TOPICAL_CREAM | CUTANEOUS | 0 refills | Status: DC

## 2017-03-15 NOTE — ED Triage Notes (Signed)
Pt c/o rash, itching, small bumps on his body for 2 weeks, reports he was around someone who has scabies and concerned he may have the same

## 2017-03-15 NOTE — ED Notes (Signed)
Pt leaving. Upset regarding wait attempted to have pt stay but he states is is going to another facility

## 2017-03-15 NOTE — ED Provider Notes (Signed)
[facility]   CSN: [id number] Arrival date & time: 03/15/17  1955   By signing my name below, I, Nicholas Jensen, attest that this documentation has been prepared under the direction and in the presence of [name], MD. Electronically signed, Nicholas Jensen, ED Scribe. 03/15/17. 10:14 PM.   History   Chief Complaint Chief Complaint  Patient presents with  . Rash   The history is provided by the patient and medical records. No language interpreter was used.  Rash   This is a new problem. The current episode started more than 1 week ago. The problem has been gradually worsening. Associated with: sick contact. There has been no fever. The rash is present on the left upper leg, right upper leg and groin. The patient is experiencing no pain.    Nicholas Jensen is a 21 y.o. male who presents to the Emergency Department with concern for gradually worsening raised, itchy, red bumps on the thighs and groin x 4 days. Associated itching on bilateral upper extremities also noted. He states he noticed gradual onset redness 2 weeks ago. Pt concerned he may have contracted scabies from a sick contact. No treatments noted at home. No modifying factors. No other complaints at this time.   History reviewed. No pertinent past medical history.  Patient Active Problem List   Diagnosis Date Noted  . Left ankle pain 02/13/2017  . Right shoulder injury 05/15/2012    Past Surgical History:  Procedure Laterality Date  . HAND SURGERY     right  . HAND SURGERY         Home Medications    Prior to Admission medications   Medication Sig Start Date End Date Taking? Authorizing Provider  doxycycline (VIBRAMYCIN) 100 MG capsule Take 1 capsule (100 mg total) by mouth 2 (two) times daily. One po bid x 7 days 07/30/15   Molpus, John, MD  HYDROcodone-acetaminophen (NORCO) 5-325 MG per tablet Take 1-2 tablets by mouth every 6 (six) hours as needed (for pain). 07/30/15   Molpus, John,  MD  HYDROcodone-acetaminophen (NORCO/VICODIN) 5-325 MG tablet Take 1 tablet by mouth every 6 (six) hours as needed. 05/20/16   Barrett, Rolm Gala, PA-C  naproxen (NAPROSYN) 500 MG tablet Take 1 tablet (500 mg total) by mouth 2 (two) times daily. 01/25/17   Tilden Fossa, MD    Family History No family history on file.  Social History Social History  Substance Use Topics  . Smoking status: Former Games developer  . Smokeless tobacco: Never Used  . Alcohol use No     Allergies   Patient has no known allergies.   Review of Systems Review of Systems  Genitourinary: Negative for discharge, penile pain and testicular pain.  Skin: Positive for color change and rash. Negative for wound.  All other systems reviewed and are negative.    Physical Exam Updated Vital Signs BP (!) 150/92   Pulse 96   Temp 98.3 F (36.8 C) (Oral)   Resp 16   SpO2 100%   Physical Exam  Constitutional: He is oriented to person, place, and time. He appears well-developed and well-nourished.  HENT:  Head: Normocephalic and atraumatic.  Eyes: EOM are normal.  Neck: Normal range of motion.  Abdominal: Soft.  Genitourinary: No penile tenderness. No discharge found.  Musculoskeletal: Normal range of motion.  Lymphadenopathy: No inguinal adenopathy noted on the right or left side.  Neurological: He is alert and oriented to person, place, and time.  Skin: Skin is  warm and dry.  Tiny papules that all seem to be associated with the hair follicle  Psychiatric: He has a normal mood and affect. Judgment normal.  Nursing note and vitals reviewed.    ED Treatments / Results  DIAGNOSTIC STUDIES: Oxygen Saturation is 100% on RA, NL by my interpretation.    COORDINATION OF CARE: 10:10 PM-Discussed next steps with pt. Pt verbalized understanding and is agreeable with the plan. Will order and Rx medications. Pt prepared for d/c, advised of symptomatic care at home and return precautions.    Labs (all labs ordered are  listed, but only abnormal results are displayed) Labs Reviewed - No data to display  EKG  EKG Interpretation None       Radiology No results found.  Procedures Procedures (including critical care time)  Medications Ordered in ED Medications - No data to display   Initial Impression / Assessment and Plan / ED Course  I have reviewed the triage vital signs and the nursing notes.  Pertinent labs & imaging results that were available during my care of the patient were reviewed by me and considered in my medical decision making (see chart for details).     20yM with what I suspect is folliculitis around groin/proximal thighs. He is concerned about possible scabies. I doubt. Discussed typical tx and also given course of bactrim. Advised to use permethrin if initial therapy does not improve.   Final Clinical Impressions(s) / ED Diagnoses   Final diagnoses:  Folliculitis    New Prescriptions Discharge Medication List as of 03/15/2017 10:14 PM    START taking these medications   Details  permethrin (ELIMITE) 5 % cream Apply to affected area once, Print    sulfamethoxazole-trimethoprim (BACTRIM DS,SEPTRA DS) 800-160 MG tablet Take 1 tablet by mouth 2 (two) times daily., Starting Sat 03/15/2017, Until Sat 03/22/2017, Print       I personally preformed the services scribed in my presence. The recorded information has been reviewed is accurate. Raeford RazorStephen Trichelle Lehan, MD.    Raeford RazorKohut, Emelynn Rance, MD 03/23/17 1351

## 2017-03-15 NOTE — ED Notes (Signed)
Pt describes rash to groin mostly x 2 weeks. Lumps appeared a couple days ago. +itching.

## 2017-03-15 NOTE — ED Notes (Signed)
Pt given d/c instructions. Rx x 2. Verbalizes understanding. No questions. 

## 2017-03-15 NOTE — ED Notes (Signed)
ED Provider at bedside. 

## 2017-03-25 ENCOUNTER — Encounter: Payer: Self-pay | Admitting: Family Medicine

## 2017-03-25 ENCOUNTER — Ambulatory Visit (INDEPENDENT_AMBULATORY_CARE_PROVIDER_SITE_OTHER): Payer: Medicaid Other | Admitting: Family Medicine

## 2017-03-25 DIAGNOSIS — M25572 Pain in left ankle and joints of left foot: Secondary | ICD-10-CM

## 2017-03-25 DIAGNOSIS — G8929 Other chronic pain: Secondary | ICD-10-CM

## 2017-03-25 NOTE — Patient Instructions (Signed)
Continue wearing supportive shoes when up and walking around. Consider inserts (something like dr. Jari Sportsmanscholls active series, spencos, superfeet) but if the shoes themselves have good arch support this isn't necessary. Avoiding flat shoes and barefoot walking is also key. Icing 15 minutes at a time 3-4 times a day as needed. Aleve or ibuprofen only if needed. Follow up with me as needed otherwise.

## 2017-03-26 NOTE — Assessment & Plan Note (Signed)
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  Much improved with footwear with better arch support.  Use ASO as needed.  Icing, aleve or ibuprofen if needed.  Consider inserts as well.  F/u prn.

## 2017-03-26 NOTE — Progress Notes (Signed)
PCP: Patient, No Pcp Per  Subjective:   HPI: Patient is a 21 y.o. male here for left ankle pain.  4/4: Patient reports he's had about 2 months now of anterolateral left ankle pain. No acute injury or trauma. States he woke up with the pain in this area. No swelling, bruising. No increase in activity level prior to this. Taking ibuprofen and aleve. Wearing an ASO. Pain worse by end of day (on feet a lot at work). Pain level up to 2/10, sharp. No skin changes, numbness.  5/15: Patient reports he's doing much better. Uses ASO only once or twice a week. Change in footwear made largest difference. Pain level 2/10 at worst, laterally, dull. Worse by end of day still if he does get pain. No skin changes, numbness.  No past medical history on file.  Current Outpatient Prescriptions on File Prior to Visit  Medication Sig Dispense Refill  . doxycycline (VIBRAMYCIN) 100 MG capsule Take 1 capsule (100 mg total) by mouth 2 (two) times daily. One po bid x 7 days 14 capsule 0  . HYDROcodone-acetaminophen (NORCO) 5-325 MG per tablet Take 1-2 tablets by mouth every 6 (six) hours as needed (for pain). 10 tablet 0  . HYDROcodone-acetaminophen (NORCO/VICODIN) 5-325 MG tablet Take 1 tablet by mouth every 6 (six) hours as needed. 8 tablet 0  . naproxen (NAPROSYN) 500 MG tablet Take 1 tablet (500 mg total) by mouth 2 (two) times daily. 14 tablet 0  . permethrin (ELIMITE) 5 % cream Apply to affected area once 60 g 0   No current facility-administered medications on file prior to visit.     Past Surgical History:  Procedure Laterality Date  . HAND SURGERY     right  . HAND SURGERY      No Known Allergies  Social History   Social History  . Marital status: Single    Spouse name: N/A  . Number of children: N/A  . Years of education: N/A   Occupational History  . Not on file.   Social History Main Topics  . Smoking status: Former Games developermoker  . Smokeless tobacco: Never Used  . Alcohol  use No  . Drug use: No  . Sexual activity: Not on file   Other Topics Concern  . Not on file   Social History Narrative  . No narrative on file    No family history on file.  BP 131/82   Pulse 77   Ht 6\' 6"  (1.981 m)   Wt 260 lb (117.9 kg)   BMI 30.05 kg/m   Review of Systems: See HPI above.     Objective:  Physical Exam:  Gen: NAD, comfortable in exam room  Left ankle/foot: Pes planus.  No other gross deformity, swelling, ecchymoses FROM with 5/5 strength all directions. Minimal TTP over sinus tarsi. Negative ant drawer and talar tilt.   Negative syndesmotic compression. Thompsons test negative. NV intact distally.  Right foot/ankle: FROM without pain.   Assessment & Plan:  1. 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  Much improved with footwear with better arch support.  Use ASO as needed.  Icing, aleve or ibuprofen if needed.  Consider inserts as well.  F/u prn.

## 2017-12-24 ENCOUNTER — Emergency Department (HOSPITAL_BASED_OUTPATIENT_CLINIC_OR_DEPARTMENT_OTHER)
Admission: EM | Admit: 2017-12-24 | Discharge: 2017-12-24 | Disposition: A | Discharge status: Home / Self Care | Payer: Self-pay | Attending: Emergency Medicine | Admitting: Emergency Medicine

## 2017-12-24 ENCOUNTER — Other Ambulatory Visit: Payer: Self-pay

## 2017-12-24 ENCOUNTER — Encounter (HOSPITAL_BASED_OUTPATIENT_CLINIC_OR_DEPARTMENT_OTHER): Payer: Self-pay | Admitting: Emergency Medicine

## 2017-12-24 ENCOUNTER — Emergency Department (HOSPITAL_BASED_OUTPATIENT_CLINIC_OR_DEPARTMENT_OTHER): Payer: Self-pay

## 2017-12-24 DIAGNOSIS — Z79899 Other long term (current) drug therapy: Secondary | ICD-10-CM | POA: Insufficient documentation

## 2017-12-24 DIAGNOSIS — F17290 Nicotine dependence, other tobacco product, uncomplicated: Secondary | ICD-10-CM | POA: Insufficient documentation

## 2017-12-24 DIAGNOSIS — J111 Influenza due to unidentified influenza virus with other respiratory manifestations: Secondary | ICD-10-CM | POA: Insufficient documentation

## 2017-12-24 DIAGNOSIS — R69 Illness, unspecified: Secondary | ICD-10-CM

## 2017-12-24 MED ORDER — BENZONATATE 100 MG PO CAPS: 100.0000 mg | ORAL_CAPSULE | Freq: Three times a day (TID) | ORAL | 0 refills | Status: DC

## 2017-12-24 MED FILL — BENZONATATE 100 MG CAPSULE: 100 | 5 days supply | Qty: 15 | Fill #0

## 2017-12-24 NOTE — ED Provider Notes (Signed)
[facility] EMERGENCY DEPARTMENT Provider Note   CSN: [id number] Arrival date & time: 12/24/17  0932     History   Chief Complaint Chief Complaint  Patient presents with  . Cough  . Fever    HPI Nicholas Jensen is a 22 y.o. male.  Patient presents with complaint of fever, fatigue, cough, sneezing, nasal congestion, pressure around the eyes without vision changes over the past 2-3 days.  Patient has been taking Tylenol at home, last dose at 9 AM.  Cough is productive of green sputum.  No nausea, vomiting, or diarrhea.  Patient states that he feels shortness of breath with activity and has had to stop doing certain activities because he feels so short of breath.  No urinary symptoms or skin rashes.  No specific sick contacts. The onset of this condition was acute, starting with cough. The course is constant.       History reviewed. No pertinent past medical history.  Patient Active Problem List   Diagnosis Date Noted  . Left ankle pain 02/13/2017  . Right shoulder injury 05/15/2012    Past Surgical History:  Procedure Laterality Date  . HAND SURGERY     right  . HAND SURGERY         Home Medications    Prior to Admission medications   Medication Sig Start Date End Date Taking? Authorizing Provider  doxycycline (VIBRAMYCIN) 100 MG capsule Take 1 capsule (100 mg total) by mouth 2 (two) times daily. One po bid x 7 days 07/30/15   Molpus, John, MD  HYDROcodone-acetaminophen (NORCO) 5-325 MG per tablet Take 1-2 tablets by mouth every 6 (six) hours as needed (for pain). 07/30/15   Molpus, John, MD  HYDROcodone-acetaminophen (NORCO/VICODIN) 5-325 MG tablet Take 1 tablet by mouth every 6 (six) hours as needed. 05/20/16   Barrett, Rolm GalaStevi, PA-C  naproxen (NAPROSYN) 500 MG tablet Take 1 tablet (500 mg total) by mouth 2 (two) times daily. 01/25/17   Tilden Fossaees, Elizabeth, MD  permethrin Verner Mould(ELIMITE) 5 % cream Apply to affected area once 03/15/17   Raeford RazorKohut, Stephen, MD    Family  History History reviewed. No pertinent family history.  Social History Social History   Tobacco Use  . Smoking status: Current Some Day Smoker    Types: E-cigarettes  . Smokeless tobacco: Never Used  Substance Use Topics  . Alcohol use: Yes    Comment: occ  . Drug use: No     Allergies   Patient has no known allergies.   Review of Systems Review of Systems  Constitutional: Positive for chills, fatigue and fever.  HENT: Positive for congestion and sneezing. Negative for ear pain, rhinorrhea, sinus pressure, sinus pain and sore throat.   Eyes: Positive for pain. Negative for photophobia, discharge, redness and itching.  Respiratory: Positive for cough and shortness of breath. Negative for wheezing.   Gastrointestinal: Negative for abdominal pain, diarrhea, nausea and vomiting.  Genitourinary: Negative for dysuria.  Musculoskeletal: Negative for myalgias and neck stiffness.  Skin: Negative for rash.  Neurological: Negative for headaches.  Hematological: Negative for adenopathy.     Physical Exam Updated Vital Signs BP 135/82 (BP Location: Left Arm)   Pulse 99   Temp (!) 101.3 F (38.5 C) (Oral)   Resp 18   Ht 6\' 6"  (1.981 m)   Wt 114.2 kg (251 lb 12.3 oz)   SpO2 99%   BMI 29.09 kg/m   Physical Exam  Constitutional: He appears well-developed and well-nourished.  HENT:  Head: Normocephalic and atraumatic.  Right Ear: Tympanic membrane, external ear and ear canal normal.  Left Ear: Tympanic membrane, external ear and ear canal normal.  Nose: No mucosal edema or rhinorrhea. Right sinus exhibits no maxillary sinus tenderness and no frontal sinus tenderness. Left sinus exhibits no maxillary sinus tenderness and no frontal sinus tenderness.  Mouth/Throat: Uvula is midline, oropharynx is clear and moist and mucous membranes are normal. Mucous membranes are not dry. No trismus in the jaw. No uvula swelling. No oropharyngeal exudate, posterior oropharyngeal edema, posterior  oropharyngeal erythema or tonsillar abscesses.  Eyes: Conjunctivae are normal. Right eye exhibits no discharge. Left eye exhibits no discharge.  Neck: Normal range of motion. Neck supple.  Cardiovascular: Normal rate, regular rhythm and normal heart sounds.  No murmur heard. Pulmonary/Chest: Effort normal and breath sounds normal. No respiratory distress. He has no wheezes. He has no rales.  Abdominal: Soft. There is no tenderness. There is no rebound and no guarding.  Lymphadenopathy:    He has no cervical adenopathy.  Neurological: He is alert.  Skin: Skin is warm and dry.  Psychiatric: He has a normal mood and affect.  Nursing note and vitals reviewed.    ED Treatments / Results   Radiology Dg Chest 2 View  Result Date: 12/24/2017 CLINICAL DATA:  Cough, shortness of breath, congestion and fever for 3 days EXAM: CHEST  2 VIEW COMPARISON:  None. FINDINGS: The heart size and mediastinal contours are within normal limits. Both lungs are clear. The visualized skeletal structures are unremarkable. IMPRESSION: No active cardiopulmonary disease. Electronically Signed   By: Elige Ko   On: 12/24/2017 11:28    Procedures Procedures (including critical care time)  Medications Ordered in ED Medications - No data to display   Initial Impression / Assessment and Plan / ED Course  I have reviewed the triage vital signs and the nursing notes.  Pertinent labs & imaging results that were available during my care of the patient were reviewed by me and considered in my medical decision making (see chart for details).     Patient seen and examined.  Patient is well-appearing with signs of an upper respiratory virus or possibly influenza.  Given his shortness of breath and productive cough, will check a chest x-ray.  If this is negative, feel discharged home with conservative and symptomatic treatment is indicated.  Vital signs reviewed and are as follows: BP 135/82 (BP Location: Left Arm)    Pulse 99   Temp (!) 101.3 F (38.5 C) (Oral)   Resp 18   Ht 6\' 6"  (1.981 m)   Wt 114.2 kg (251 lb 12.3 oz)   SpO2 99%   BMI 29.09 kg/m   11:38 AM CXR negative. Patient discharged to home. Encouraged to rest and drink plenty of fluids.  Patient told to return to ED or see their primary doctor if their symptoms worsen, high fever not controlled with tylenol, persistent vomiting, they feel they are dehydrated, or if they have any other concerns.  Patient verbalized understanding and agreed with plan.     Final Clinical Impressions(s) / ED Diagnoses   Final diagnoses:  Influenza-like illness   Patient with symptoms consistent with influenza. CXR was ordered due to SOB. This was clear. No hypoxia or tachycardia. No significant CP to suggest PE. Vitals are stable, low-grade fever. No signs of dehydration, tolerating PO's. Lungs are clear. Supportive therapy indicated with return if symptoms worsen. Patient counseled.   ED Discharge Orders  Ordered    benzonatate (TESSALON) 100 MG capsule  Every 8 hours     12/24/17 1135       Renne Crigler, PA-C 12/24/17 1141    Cardama, Amadeo Garnet, MD 12/24/17 1323

## 2017-12-24 NOTE — ED Triage Notes (Addendum)
Pt c/o cough, congestion, fever and pressure around eyes x 2 days; prod cough with green sputum; had Tylenol at approx 1 hr ago.

## 2017-12-24 NOTE — Discharge Instructions (Signed)
Please read and follow all provided instructions.  Your diagnoses today include:  1. Influenza-like illness    Tests performed today include:  Chest x-ray - no pneumonia  Vital signs. See below for your results today.   Medications prescribed:   Tessalon Perles - cough suppressant medication  Take any prescribed medications only as directed.  Home care instructions:  Follow any educational materials contained in this packet. Please continue drinking plenty of fluids. Use over-the-counter cold and flu medications as needed as directed on packaging for symptom relief. You may also use ibuprofen or tylenol as directed on packaging for pain or fever.   BE VERY CAREFUL not to take multiple medicines containing Tylenol (also called acetaminophen). Doing so can lead to an overdose which can damage your liver and cause liver failure and possibly death.   Follow-up instructions: Please follow-up with your primary care provider in the next 3 days for further evaluation of your symptoms.   Return instructions:   Please return to the Emergency Department if you experience worsening symptoms.  Please return if you have a high fever greater than 101 degrees not controlled with over-the-counter medications, persistent vomiting and cannot keep down fluids, or worsening trouble breathing.  Please return if you have any other emergent concerns.  Additional Information:  Your vital signs today were: BP 135/82 (BP Location: Left Arm)    Pulse 99    Temp (!) 101.3 F (38.5 C) (Oral)    Resp 18    Ht 6\' 6"  (1.981 m)    Wt 114.2 kg (251 lb 12.3 oz)    SpO2 99%    BMI 29.09 kg/m  If your blood pressure (BP) was elevated above 135/85 this visit, please have this repeated by your doctor within one month.

## 2018-09-24 IMAGING — CR DG CHEST 2V
2 series · 2 of 2 positions shown · non-contrast
Comparison: None.

CLINICAL DATA: Cough, shortness of breath, congestion and fever for
3 days

EXAM:
CHEST  2 VIEW

[w chest pa]
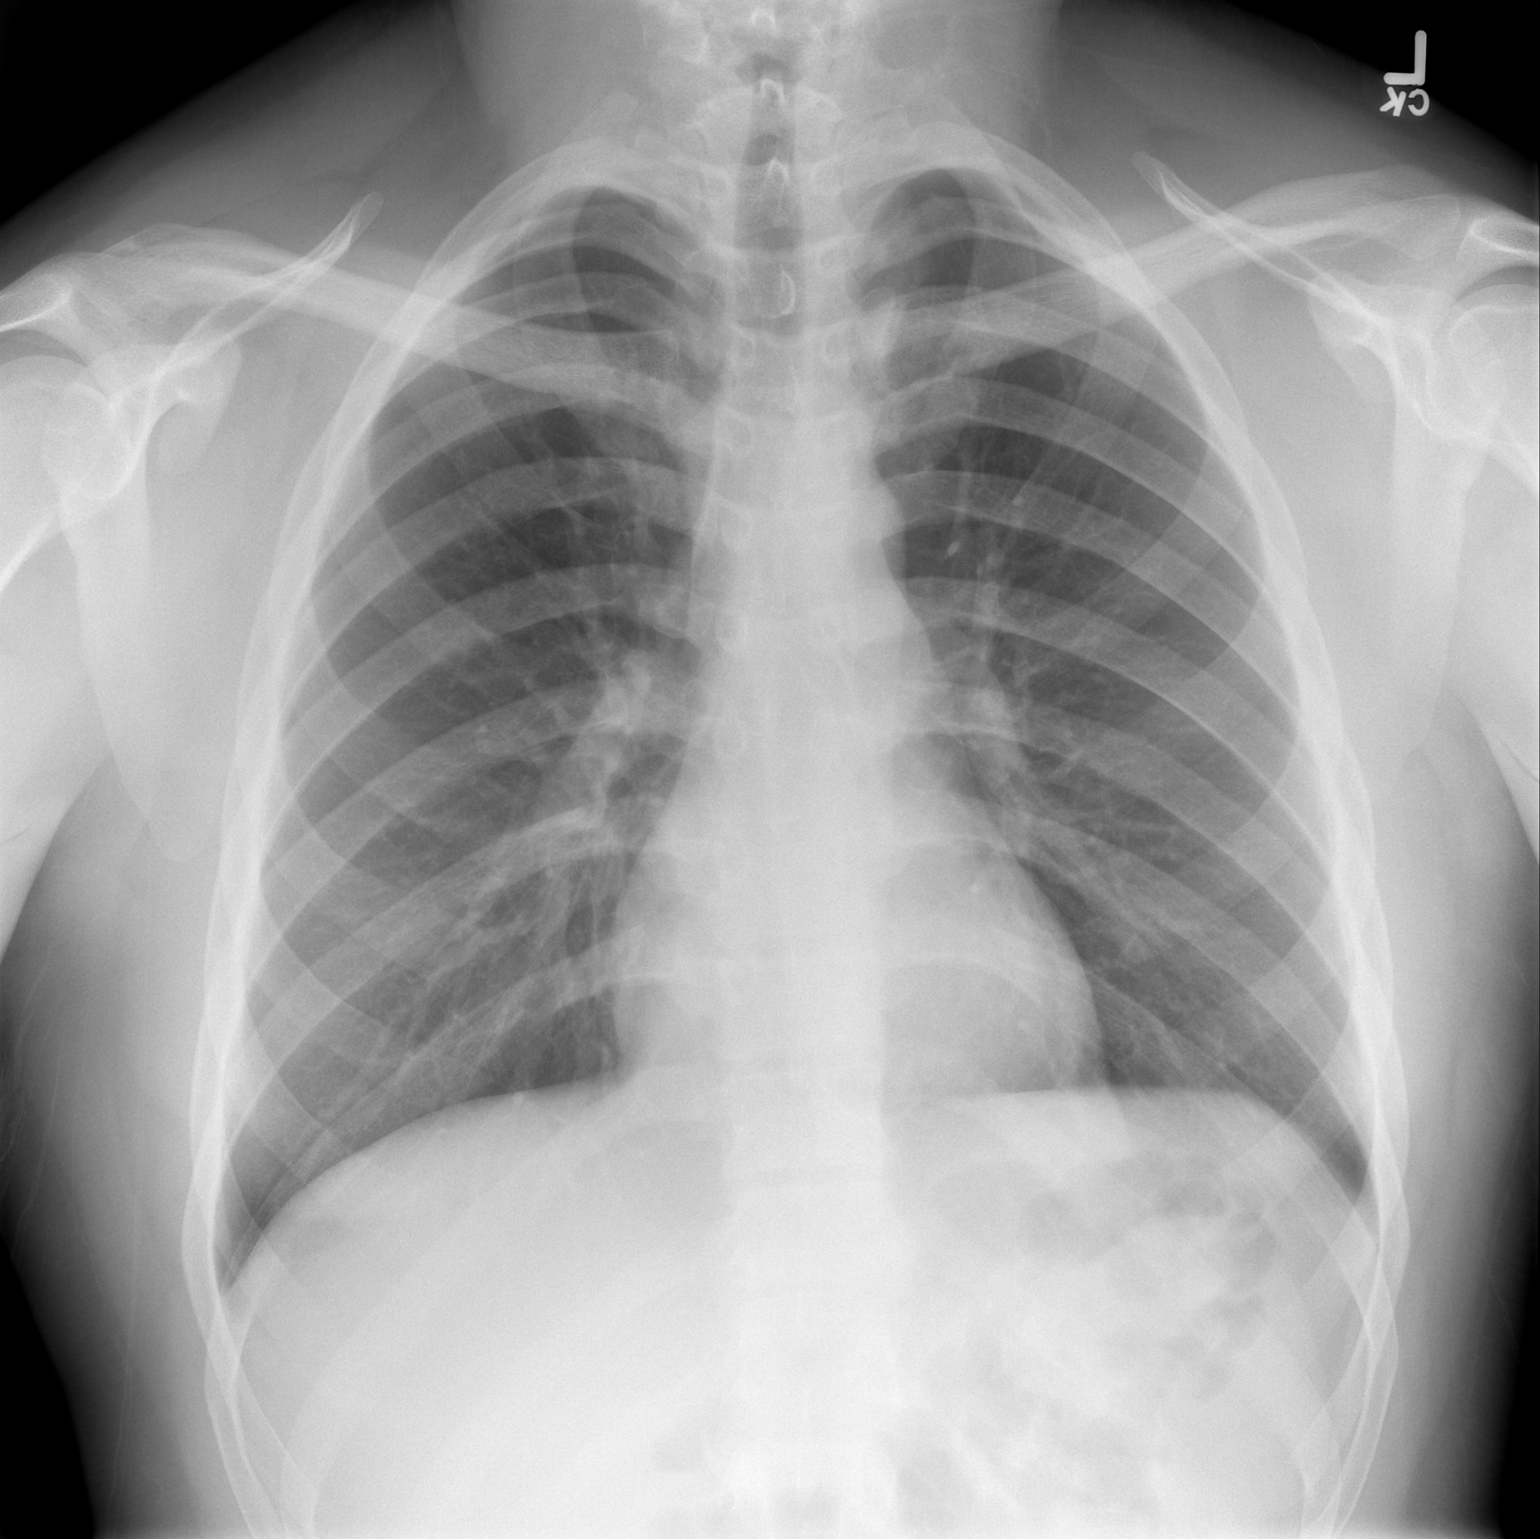

[w chest lat]
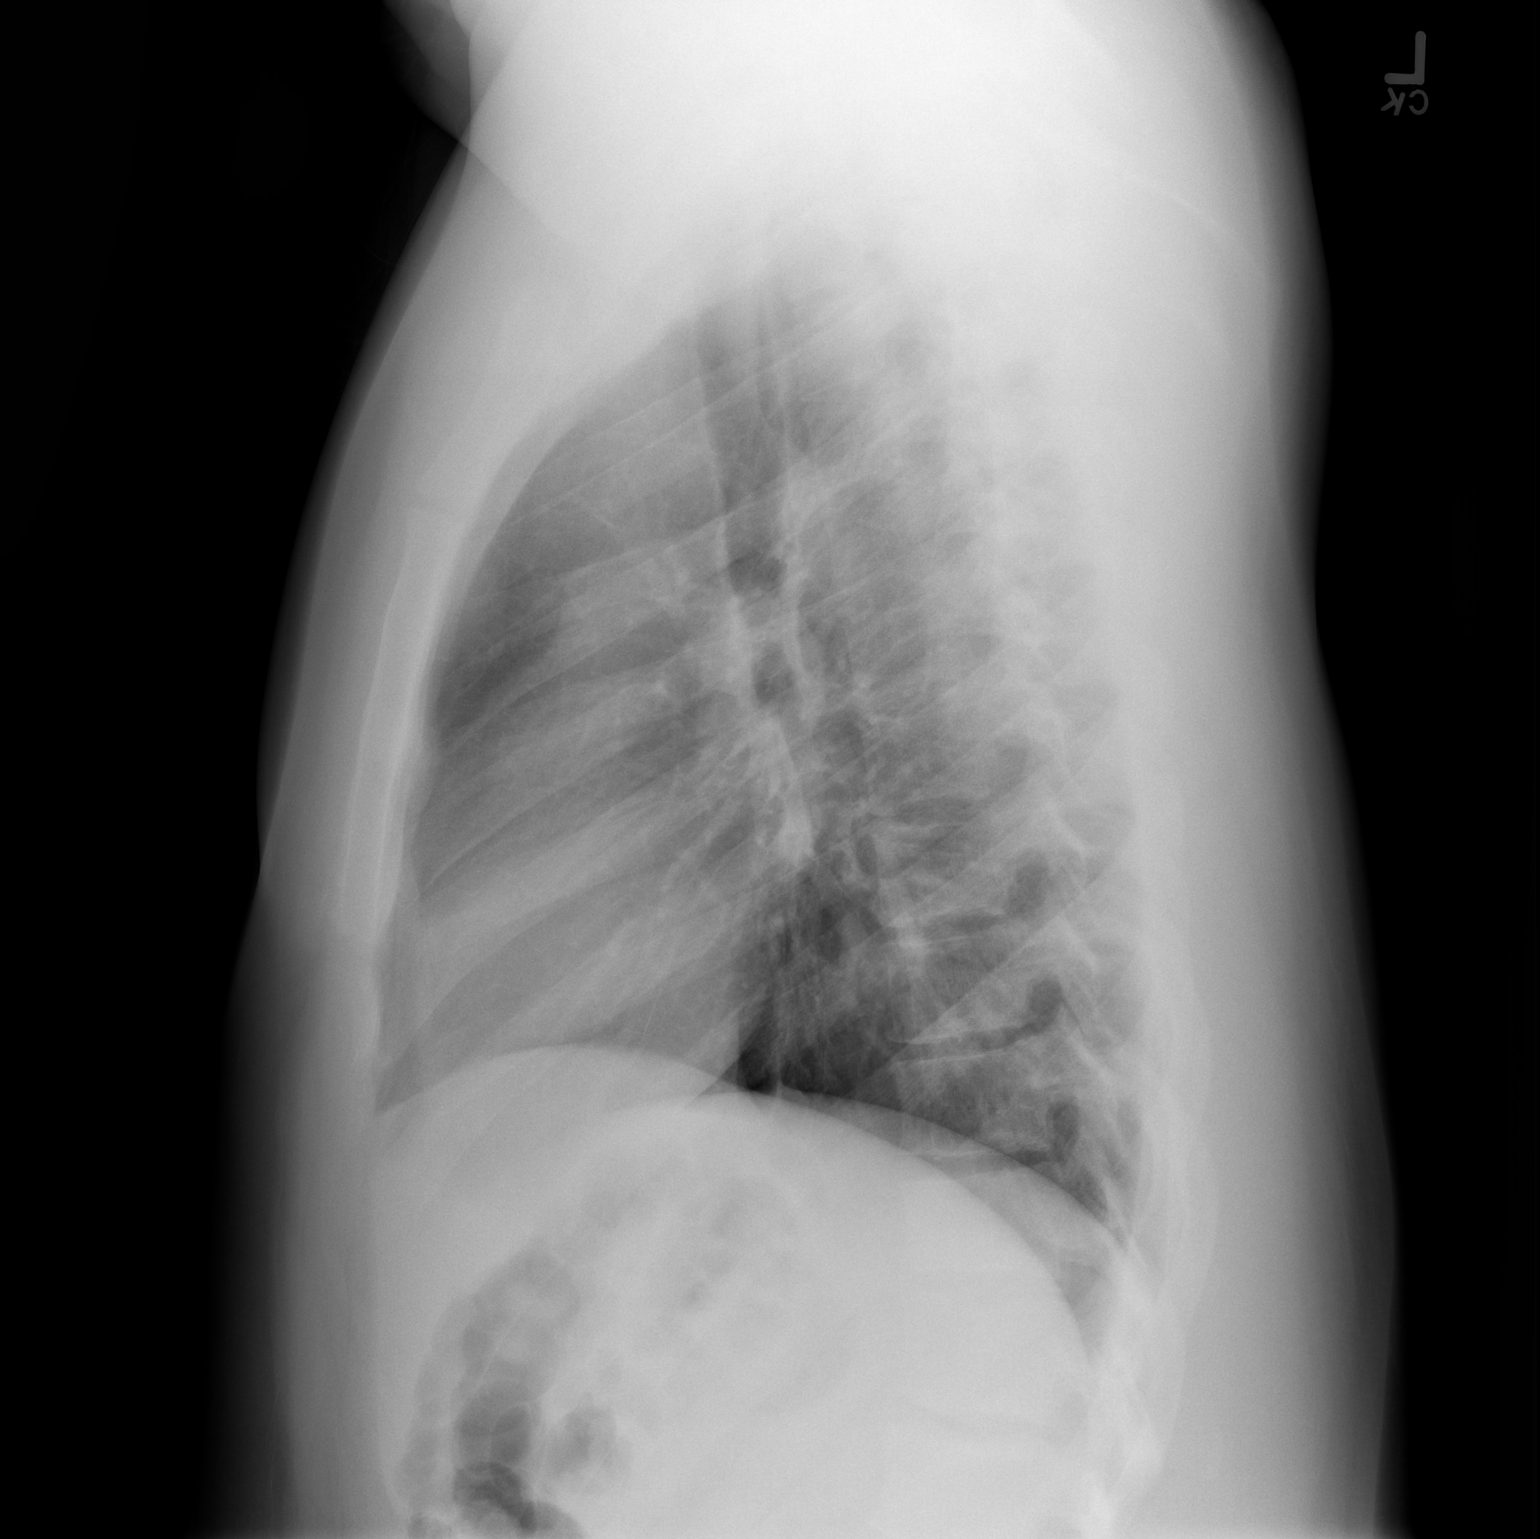

[2 of 2 positions shown; findings below may reference images not displayed]

FINDINGS: The heart size and mediastinal contours are within normal limits.
Both lungs are clear. The visualized skeletal structures are
unremarkable.
IMPRESSION: No active cardiopulmonary disease.

## 2020-02-26 ENCOUNTER — Ambulatory Visit: Payer: Medicaid Other | Attending: Internal Medicine

## 2020-02-26 DIAGNOSIS — Z23 Encounter for immunization: Secondary | ICD-10-CM

## 2020-02-26 NOTE — Progress Notes (Signed)
   Covid-19 Vaccination Clinic  Name:  Nicholas Jensen    MRN: [id number] DOB: 12-May-1996  02/26/2020  Mr. [name] was observed post Covid-19 immunization for 15 minutes without incident. He was provided with Vaccine Information Sheet and instruction to access the V-Safe system.   Mr. [name] was instructed to call 911 with any severe reactions post vaccine: Marland Kitchen Difficulty breathing  . Swelling of face and throat  . A fast heartbeat  . A bad rash all over body  . Dizziness and weakness   Immunizations Administered    Name Date Dose VIS Date Route   Pfizer COVID-19 Vaccine 02/26/2020  2:20 PM 0.3 mL 10/22/2019 Intramuscular   Manufacturer: [employer], [employer]   Lot: [id number]   NDC: [address]-2

## 2020-03-21 ENCOUNTER — Ambulatory Visit: Payer: Medicaid Other | Attending: Internal Medicine

## 2020-03-21 DIAGNOSIS — Z23 Encounter for immunization: Secondary | ICD-10-CM

## 2020-03-21 NOTE — Progress Notes (Signed)
   Covid-19 Vaccination Clinic  Name:  Nicholas Jensen    MRN: [id number] DOB: 1996-07-02  03/21/2020  [name] was observed post Covid-19 immunization for 15 minutes without incident. He was provided with Vaccine Information Sheet and instruction to access the V-Safe system.   [name] was instructed to call 911 with any severe reactions post vaccine: Marland Kitchen Difficulty breathing  . Swelling of face and throat  . A fast heartbeat  . A bad rash all over body  . Dizziness and weakness   Immunizations Administered    Name Date Dose VIS Date Route   Pfizer COVID-19 Vaccine 03/21/2020  9:25 AM 0.3 mL 01/05/2019 Intramuscular   Manufacturer: [employer], [employer]   Lot: [id number]   NDC: [address]-3

## 2020-04-27 DIAGNOSIS — J383 Other diseases of vocal cords: Secondary | ICD-10-CM | POA: Insufficient documentation

## 2020-04-27 HISTORY — DX: Other diseases of vocal cords: J38.3

## 2020-07-26 ENCOUNTER — Other Ambulatory Visit: Payer: Medicaid Other

## 2020-07-26 ENCOUNTER — Other Ambulatory Visit: Payer: Self-pay

## 2020-07-26 DIAGNOSIS — Z20822 Contact with and (suspected) exposure to covid-19: Secondary | ICD-10-CM

## 2020-07-28 LAB — NOVEL CORONAVIRUS, NAA: SARS-CoV-2, NAA: DETECTED — AB

## 2020-07-28 LAB — SARS-COV-2, NAA 2 DAY TAT

## 2020-07-29 ENCOUNTER — Telehealth (HOSPITAL_COMMUNITY): Payer: Self-pay | Admitting: Nurse Practitioner

## 2020-07-29 ENCOUNTER — Encounter: Payer: Self-pay | Admitting: Nurse Practitioner

## 2020-07-29 DIAGNOSIS — U071 COVID-19: Secondary | ICD-10-CM

## 2020-07-29 NOTE — Telephone Encounter (Signed)
Sent mychart and text to patient to discuss covid symptoms and the use of regeneron, a monoclonal antibody infusion for those with mild to moderate Covid symptoms and at a high risk of hospitalization.     Pt is qualified for this infusion at the [facility] infusion center due to co-morbid conditions and/or a member of an at-risk group.    Consuello Masse, DNP, AGNP-C [phone] (Infusion Center Hotline)

## 2021-02-16 ENCOUNTER — Encounter (HOSPITAL_COMMUNITY): Payer: Self-pay | Admitting: Licensed Clinical Social Worker

## 2021-02-16 ENCOUNTER — Ambulatory Visit (INDEPENDENT_AMBULATORY_CARE_PROVIDER_SITE_OTHER): Payer: No Payment, Other | Admitting: Licensed Clinical Social Worker

## 2021-02-16 ENCOUNTER — Other Ambulatory Visit: Payer: Self-pay

## 2021-02-16 DIAGNOSIS — F331 Major depressive disorder, recurrent, moderate: Secondary | ICD-10-CM | POA: Diagnosis not present

## 2021-02-16 DIAGNOSIS — F411 Generalized anxiety disorder: Secondary | ICD-10-CM | POA: Insufficient documentation
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
## 2021-03-29 ENCOUNTER — Ambulatory Visit (INDEPENDENT_AMBULATORY_CARE_PROVIDER_SITE_OTHER): Payer: No Payment, Other | Admitting: Licensed Clinical Social Worker

## 2021-03-29 ENCOUNTER — Other Ambulatory Visit: Payer: Self-pay

## 2021-03-29 DIAGNOSIS — F411 Generalized anxiety disorder: Secondary | ICD-10-CM

## 2021-03-29 DIAGNOSIS — F331 Major depressive disorder, recurrent, moderate: Secondary | ICD-10-CM | POA: Diagnosis not present

## 2021-03-29 NOTE — Progress Notes (Addendum)
THERAPIST PROGRESS NOTE  Session Time: 2   Participation Level: Active  Behavioral Response: CasualAlertAnxious and Depressed  Type of Therapy: Individual Therapy  Treatment Goals addressed: Diagnosis: Major depression and GAD   Interventions: CBT and Supportive  Summary: Nicholas Jensen is a 25 y.o. male who presents with  Major depression and GAD.   Suicidal/Homicidal: NAwithout intent/plan   Virtual Visit via Video Note  I connected with Nicholas Jensen on 03/29/21 at  4:00 PM EDT by a video enabled telemedicine application and verified that I am speaking with the correct person using two identifiers.  Location: Patient: [facility]  Provider: Home    I discussed the limitations of evaluation and management by telemedicine and the availability of in person appointments. The patient expressed understanding and agreed to proceed.   Therapist Response:    Subjective/Objective:  Pt was alert and oriented x 5. He was dressed casually an engaged well in therapy session. Pt presented with anxious, and irritable mood/affect. He was cooperative and maintained good eye contact.     Pt started out session anxious and depressed. LCSW spoke with pt about the different aspects of life. He states things have been the same with home life. His mom and stepdad are sick and not in great health. Pt attempted to help, when possible, by spending time with them or making them dinner. 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 He is currently in the process of training and states that his supervisor could be nicer, but he does not hate the job.   Nicholas Jensen reports that he struggles with isolation. He does not like to be around people. It does improve his depression and anxiety when he is in control environment. LCSW spoke with pt about getting outside of his comfort zone. This is an attempt to train the unconscious mind to condition itself to be around people in a  controlled environment.   LCSW stated that self-motivation will be a key part to this as he does not want to utilize medications as this time. LCSW used the example of being in a hole, if you sit there you will remain in the hole. If you attempt to get out, you may not be able to on the first try but you will learn what not to do for the next time. Nicholas Jensen then start to shut down giving limited few words responses. He asked LCSW to stop session today. LCSW asked for feedback about what happened to make him shut down. Pt stated that he was feeling overwhelmed and "was not feeling the suggestions given". LCSW agreed to end session early and make another appt in session     Assessment/Plan: Pt endorses symptoms for irritability, tension, worry, fatigue, mood swings, flat affect, low motivation, hopelessness, and insomnia. He meets criteria for MDD and GAD. Pt is not taking medications at this time although offered it as an option by LCSW to schedule an appointment. Plan moving forward to start listing thoughts and feeling on paper 2 to 3 times weekly then progress to full sentence in attempt to engage in journallying. Nicholas Jensen was agreeable to plan.     I discussed the assessment and treatment plan with the patient. The patient was provided an opportunity to ask questions and all were answered. The patient agreed with the plan and demonstrated an understanding of the instructions.   The patient was advised to call back or seek an in-person evaluation if the symptoms  worsen or if the condition fails to improve as anticipated.  I provided 45 minutes of non-face-to-face time during this encounter.   Weber Cooks, LCSW   Plan: Return again in 4 weeks.      Weber Cooks, LCSW 03/29/2021

## 2021-04-26 ENCOUNTER — Ambulatory Visit (HOSPITAL_COMMUNITY): Payer: Self-pay | Admitting: Licensed Clinical Social Worker

## 2023-06-19 ENCOUNTER — Encounter: Payer: Self-pay | Admitting: Internal Medicine

## 2023-06-19 ENCOUNTER — Ambulatory Visit: Payer: 59 | Admitting: Internal Medicine

## 2023-06-19 ENCOUNTER — Telehealth: Payer: Self-pay | Admitting: Internal Medicine

## 2023-06-19 ENCOUNTER — Ambulatory Visit (INDEPENDENT_AMBULATORY_CARE_PROVIDER_SITE_OTHER): Payer: 59 | Admitting: Internal Medicine

## 2023-06-19 VITALS — BP 120/84 | HR 74 | Temp 98.1°F | Ht 76.25 in | Wt 295.8 lb

## 2023-06-19 DIAGNOSIS — Z23 Encounter for immunization: Secondary | ICD-10-CM | POA: Diagnosis not present

## 2023-06-19 NOTE — Telephone Encounter (Signed)
I received a call from patient mother concerned about his new PCP. He came in today for a new patient visit with Salvatore Decent, NP. Pt tried to add her as PCP on [employer] plan and was told she is an Neurosurgeon. I have advised pt to add PCP of Dr. [place] Desanctis, as he is Ashley's supervising physician. I have emailed our credentialing team to f/u on status.

## 2023-06-19 NOTE — Progress Notes (Signed)
[facility] PRIMARY [facility]ARE [facility] [address] [place] [address] Dept: [phone] Dept Fax: [phone]  New Patient Office Visit  Subjective:   Nicholas Jensen [birth year] 06/19/2023  Chief Complaint  Patient presents with   Establish Care    Needs CPE before September 1    HPI: Nicholas Jensen presents today to establish care at [facility] [employer]. Introduced to Publishing rights manager role and practice setting.  All questions answered.  Concerns: See below   Nicholas Jensen is a 27 year old male who presents to establish primary care.  He works at [employer] in the [employer].  He has no concerns or questions at today's visit.  He is due for a tetanus booster and annual physical.  The following portions of the patient's history were reviewed and updated as appropriate: past medical history, past surgical history, family history, social history, allergies, medications, and problem list.   There are no problems to display for this patient.  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 No pertinent family history. Outpatient Medications Prior to Visit  Medication Sig Dispense Refill   benzonatate (TESSALON) 100 MG capsule Take 1 capsule (100 mg total) by mouth every 8 (eight) hours. (Patient not taking: Reported on 06/19/2023) 15 capsule 0   doxycycline (VIBRAMYCIN) 100 MG capsule Take 1 capsule (100 mg total) by  mouth 2 (two) times daily. One po bid x 7 days (Patient not taking: Reported on 06/19/2023) 14 capsule 0   HYDROcodone-acetaminophen (NORCO) 5-325 MG per tablet Take 1-2 tablets by mouth every 6 (six) hours as needed (for pain). 10 tablet 0   HYDROcodone-acetaminophen (NORCO/VICODIN) 5-325 MG tablet Take 1 tablet by mouth every 6 (six) hours as needed. 8 tablet 0   naproxen (NAPROSYN) 500 MG tablet Take 1 tablet (500 mg total) by mouth 2 (two) times daily. 14 tablet 0   permethrin (ELIMITE) 5 % cream Apply to affected area once 60 g 0   No facility-administered medications prior to visit.   No Known Allergies  ROS: A complete ROS was performed with pertinent positives/negatives noted in the HPI. The remainder of the ROS are negative.   Objective:   Today's Vitals   06/19/23 1344  BP: 120/84  Pulse: 74  Temp: 98.1 F (36.7 C)  TempSrc: Temporal  SpO2: 98%  Weight: 295 lb 12.8 oz (134.2 kg)  Height: 6' 4.25" (1.937 m)    GENERAL: Well-appearing, in NAD. Well nourished.  SKIN: Pink, warm and dry. No rash, lesion, ulceration, or ecchymoses.  NECK: Trachea midline. Full ROM w/o pain or tenderness. No lymphadenopathy.  RESPIRATORY: Chest wall symmetrical. Respirations even and non-labored. Breath sounds clear to auscultation bilaterally.  CARDIAC: S1, S2 present, regular rate and rhythm. Peripheral pulses 2+ bilaterally.  EXTREMITIES: Without clubbing, cyanosis,  or edema.  PSYCH/MENTAL STATUS: Alert, oriented x 3. Cooperative, appropriate mood and affect.   Health Maintenance Due  Topic Date Due   HIV Screening  Never done   Hepatitis C Screening  Never done   DTaP/Tdap/Td (1 - Tdap) Never done   INFLUENZA VACCINE  06/12/2023    No results found for any visits on 06/19/23.  Assessment & Plan:  1. Need for tetanus booster - Tdap vaccine greater than or equal to 7yo IM    Orders Placed This Encounter  Procedures   Tdap vaccine greater than or equal to 7yo IM     Need for  tetanus booster -     Tdap vaccine greater than or equal to 7yo IM     Return in about 1 week (around 06/26/2023) for Fasting Annual Physical Exam.   Of note, portions of this note may have been created with voice recognition software Physicist, medical). While this note has been edited for accuracy, occasional wrong-word or 'sound-a-like' substitutions may have occurred due to the inherent limitations of voice recognition software.  Salvatore Decent, FNP

## 2023-06-20 NOTE — Telephone Encounter (Signed)
Noted  

## 2023-07-01 ENCOUNTER — Ambulatory Visit: Payer: Medicaid Other | Admitting: Family Medicine

## 2023-07-10 ENCOUNTER — Ambulatory Visit (INDEPENDENT_AMBULATORY_CARE_PROVIDER_SITE_OTHER): Payer: 59 | Admitting: Internal Medicine

## 2023-07-10 ENCOUNTER — Other Ambulatory Visit (HOSPITAL_COMMUNITY)
Admission: RE | Admit: 2023-07-10 | Discharge: 2023-07-10 | Disposition: A | Discharge status: Home / Self Care | Payer: 59 | Source: Ambulatory Visit | Attending: Internal Medicine | Admitting: Internal Medicine

## 2023-07-10 ENCOUNTER — Encounter: Payer: Self-pay | Admitting: Internal Medicine

## 2023-07-10 VITALS — BP 108/70 | HR 87 | Temp 98.0°F | Ht 76.5 in | Wt 297.8 lb

## 2023-07-10 DIAGNOSIS — Z0000 Encounter for general adult medical examination without abnormal findings: Secondary | ICD-10-CM

## 2023-07-10 DIAGNOSIS — Z113 Encounter for screening for infections with a predominantly sexual mode of transmission: Secondary | ICD-10-CM | POA: Diagnosis not present

## 2023-07-10 DIAGNOSIS — Z1159 Encounter for screening for other viral diseases: Secondary | ICD-10-CM

## 2023-07-10 NOTE — Progress Notes (Signed)
Subjective:   Nicholas Jensen 04-21-1996 07/10/2023  CC: Chief Complaint  Patient presents with   Annual Exam    HPI: Nicholas Jensen is a 27 y.o. male who presents for a routine health maintenance exam.  Labs collected at time of visit.   STD SCREENING: Nicholas Jensen presents for STD screening.   Sexual activity:  Not sexually active, but has had 2 new male partners since last STD testing Recent unprotected intercourse: yes Recent known exposure to STD's: no  Genital lesions: no Genital discharge: no Dysuria: no Fevers: no Rash: no  HEALTH SCREENINGS: - PSA (50+): Not applicable  No results found for: "PSA1", "PSA"  - Colonoscopy (45+): Not applicable  - AAA Screening: Not applicable  Men age 90-75 who have ever smoked - Lung Cancer screening with low-dose CT: Not applicable-  Adults age 65-80 who are current cigarette smokers or quit within the last 15 years. Must have 20 pack year history.   Depression and Anxiety Screen done today and results listed below:     07/10/2023    3:39 PM 06/19/2023    1:49 PM 02/16/2021   10:15 AM  Depression screen PHQ 2/9  Decreased Interest 0 0   Down, Depressed, Hopeless 0 0   PHQ - 2 Score 0 0   Altered sleeping 0 0   Tired, decreased energy 0 0   Change in appetite 0 0   Feeling bad or failure about yourself  0 0   Trouble concentrating 0 1   Moving slowly or fidgety/restless 0 0   Suicidal thoughts 0 0   PHQ-9 Score 0 1   Difficult doing work/chores Not difficult at all Not difficult at all      Information is confidential and restricted. Go to Review Flowsheets to unlock data.      07/10/2023    3:40 PM 06/19/2023    1:49 PM  GAD 7 : Generalized Anxiety Score  Nervous, Anxious, on Edge 0 0  Control/stop worrying 0 0  Worry too much - different things 0 0  Trouble relaxing 0 0  Restless 0 0  Easily annoyed or irritable 0 0  Afraid - awful might happen 0 0  Total GAD 7 Score 0 0  Anxiety Difficulty Not difficult  at all Not difficult at all    IMMUNIZATIONS:  - Tdap: Tetanus vaccination status reviewed: last tetanus booster within 10 years. - Influenza: Postponed to flu season - Pneumovax: Not applicable - Prevnar: Not applicable - Zostavax vaccine (50+): Not applicable   Past medical history, surgical history, medications, allergies, family history and social history reviewed with patient today and changes made to appropriate areas of the chart.   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  No current facility-administered medications on file prior to visit.    No Known Allergies   Social History   Socioeconomic History   Marital status: Single    Spouse name: Not on file   Number of children: Not on file   Years of education: Not on file   Highest education level: Not on file  Occupational History   Not on file  Tobacco Use   Smoking status: Never   Smokeless tobacco: Never  Vaping Use   Vaping status: Some Days  Substance and Sexual Activity   Alcohol use: Yes    Alcohol/week: 4.0 standard drinks of alcohol    Types: 4 Standard drinks or equivalent per week   Drug use: Yes    Types: Marijuana    Comment: about1/8 or 3.5 grams daily, in a  blunt or water pipe    Sexual activity: Yes    Partners: Female    Birth control/protection: None    Comment: 2 - 3 per week   Other Topics Concern   Not on file  Social History  Narrative   Not on file   Social Determinants of Health   Financial Resource Strain: Medium Risk (02/16/2021)   Overall Financial Resource Strain (CARDIA)    Difficulty of Paying Living Expenses: Somewhat hard  Food Insecurity: No Food Insecurity (02/16/2021)   Hunger Vital Sign    Worried About Running Out of Food in the Last Year: Never true    Ran Out of Food in the Last Year: Never true  Transportation Needs: No Transportation Needs (02/16/2021)   PRAPARE - Administrator, Civil Service (Medical): No    Lack of Transportation (Non-Medical): No  Physical Activity: Insufficiently Active (02/16/2021)   Exercise Vital Sign    Days of Exercise per Week: 1 day    Minutes of Exercise per Session: 30 min  Stress: Stress Concern Present (02/16/2021)   [employer] of Occupational Health - Occupational Stress Questionnaire    Feeling of Stress : Rather much  Social Connections: Socially Isolated (02/16/2021)   Social Connection and Isolation Panel [NHANES]    Frequency of Communication with Friends and Family: Twice a week    Frequency of Social Gatherings with Friends and Family: Never    Attends Religious Services: Never    Database administrator or Organizations: No    Attends Banker Meetings: Never    Marital Status: Never married  Intimate Partner Violence: Not At Risk (02/16/2021)   Humiliation, Afraid, Rape, and Kick questionnaire    Fear of Current or Ex-Partner: No    Emotionally Abused: No    Physically Abused: No    Sexually Abused: No   Social History   Tobacco Use  Smoking Status Never  Smokeless Tobacco Never   Social History   Substance and Sexual Activity  Alcohol Use Yes   Alcohol/week: 4.0 standard drinks of alcohol   Types: 4 Standard drinks or equivalent per week     History reviewed. No pertinent family history.   ROS: Denies fever, fatigue, unexplained weight loss/gain, hearing or vision changes, cardiac or respiratory  complaints. Denies neurological deficits, musculoskeletal complaints, gastrointestinal or genitourinary complaints, mental health complaints, and skin changes.   Objective:   Today's Vitals   07/10/23 1537  BP: 108/70  Pulse: 87  Temp: 98 F (36.7 C)  TempSrc: Temporal  SpO2: 97%  Weight: 297 lb 12.8 oz (135.1 kg)  Height: 6' 4.5" (1.943 m)  GENERAL APPEARANCE: Well-appearing, in NAD. Well nourished.  SKIN: Pink, warm and dry. Turgor normal. No rash, lesion, ulceration, or ecchymoses. Hair evenly distributed.  HEENT: HEAD: Normocephalic.  EYES: PERRLA. EOMI. Lids intact w/o defect. Sclera white, Conjunctiva pink w/o exudate.  EARS: External ear w/o redness, swelling, masses or lesions. EAC clear. TM's intact, translucent w/o bulging, appropriate landmarks visualized. Appropriate acuity to conversational tones.  NOSE: Septum midline w/o deformity. Nares patent, mucosa pink and non-inflamed w/o drainage. No sinus tenderness.  THROAT: Uvula midline. Oropharynx clear. Tonsils non-inflamed w/o exudate . Oral mucosa pink and moist.  NECK: Supple, Trachea midline. Full ROM w/o pain or tenderness. No lymphadenopathy. Thyroid non-tender w/o enlargement or palpable masses.  RESPIRATORY: Chest wall symmetrical w/o masses. Respirations even and non-labored. Breath sounds clear to auscultation bilaterally. No wheezes, rales, rhonchi, or crackles. CARDIAC: S1, S2 present, regular rate and rhythm. No gallops, murmurs, rubs, or clicks. No carotid bruits. Capillary refill <2 seconds. Peripheral pulses 2+ bilaterally. GI: Abdomen soft w/o distention. Normoactive bowel sounds. No palpable masses or tenderness. No guarding or rebound tenderness. Liver and spleen w/o tenderness or enlargement. No CVA tenderness.  GU:  deferred exam. MSK: Muscle tone and strength appropriate for age, w/o atrophy or abnormal movement. EXTREMITIES: Active ROM intact, w/o tenderness, crepitus, or contracture. No obvious joint  deformities or effusions. No clubbing, edema, or cyanosis.  NEUROLOGIC: CN's II-XII intact. Motor strength symmetrical with no obvious weakness. No sensory deficits. DTR 2+ symmetric bilaterally. Steady, even gait.  PSYCH/MENTAL STATUS: Alert, oriented x 3. Cooperative, appropriate mood and affect.     Assessment & Plan:  Encounter for general adult medical examination without abnormal findings -     CBC with Differential/Platelet -     Comprehensive metabolic panel -     Lipid panel -     TSH  Screen for STD (sexually transmitted disease) -     RPR -     GC/Chlamydia probe amp ([place])not at [facility] -     HIV Antibody (routine testing w rflx)  Need for hepatitis C screening test -     Hepatitis C antibody   Orders Placed This Encounter  Procedures   CBC with Differential/Platelet   Comprehensive metabolic panel   Lipid panel   TSH   RPR   HIV antibody (with reflex)   Hepatitis C antibody    PATIENT COUNSELING: - Encouraged to adjust caloric intake to maintain or achieve ideal body weight, to reduce intake of dietary saturated fat and total fat, to limit sodium intake by avoiding high sodium foods and not adding table salt, and to maintain adequate dietary potassium and calcium preferably from fresh fruits, vegetables, and low-fat dairy products.   - Advised to avoid cigarette smoking. - Discussed with the patient that most people either abstain from alcohol or drink within safe limits (<=14/week and <=4 drinks/occasion for males, <=7/weeks and <= 3 drinks/occasion for females) and that the risk for alcohol disorders and other health effects rises proportionally with the number of drinks per week and how often a drinker exceeds daily limits. - Discussed cessation/primary prevention of drug use and availability of treatment for abuse.   - Stressed the importance of regular exercise - Injury prevention: Discussed safety belts, safety helmets, smoke detector, smoking near  bedding or upholstery.  - Dental health: Discussed importance of regular tooth brushing, flossing, and dental visits.  - Sexuality: Discussed sexually transmitted diseases, partner selection, use of condoms, avoidance of unintended pregnancy  and  contraceptive alternatives.    NEXT PREVENTATIVE PHYSICAL DUE IN 1 YEAR.  Return in about 1 year (around 07/09/2024) for Fasting Annual Physical Exam.  Salvatore Decent, FNP

## 2023-07-11 LAB — CBC WITH DIFFERENTIAL/PLATELET
Basophils Absolute: 0 10*3/uL (ref 0.0–0.1)
Basophils Relative: 1 % (ref 0.0–3.0)
Eosinophils Absolute: 0.1 10*3/uL (ref 0.0–0.7)
Eosinophils Relative: 1.8 % (ref 0.0–5.0)
HCT: 42 % (ref 39.0–52.0)
Hemoglobin: 14.3 g/dL (ref 13.0–17.0)
Lymphocytes Relative: 43.8 % (ref 12.0–46.0)
Lymphs Abs: 1.7 10*3/uL (ref 0.7–4.0)
MCHC: 34 g/dL (ref 30.0–36.0)
MCV: 86.4 fl (ref 78.0–100.0)
Monocytes Absolute: 0.4 10*3/uL (ref 0.1–1.0)
Monocytes Relative: 10.7 % (ref 3.0–12.0)
Neutro Abs: 1.7 10*3/uL (ref 1.4–7.7)
Neutrophils Relative %: 42.7 % — ABNORMAL LOW (ref 43.0–77.0)
Platelets: 243 10*3/uL (ref 150.0–400.0)
RBC: 4.86 Mil/uL (ref 4.22–5.81)
RDW: 14.2 % (ref 11.5–15.5)
WBC: 3.9 10*3/uL — ABNORMAL LOW (ref 4.0–10.5)

## 2023-07-11 LAB — TSH: TSH: 0.87 u[IU]/mL (ref 0.35–5.50)

## 2023-07-11 LAB — LDL CHOLESTEROL, DIRECT: Direct LDL: 185 mg/dL

## 2023-07-11 LAB — LIPID PANEL
Cholesterol: 214 mg/dL — ABNORMAL HIGH (ref 0–200)
HDL: 28.9 mg/dL — ABNORMAL LOW (ref >39.00)
NonHDL: 185.11
Total CHOL/HDL Ratio: 7
Triglycerides: 395 mg/dL — ABNORMAL HIGH (ref 0.0–149.0)
VLDL: 79 mg/dL — ABNORMAL HIGH (ref 0.0–40.0)

## 2023-07-11 LAB — COMPREHENSIVE METABOLIC PANEL
ALT: 52 U/L (ref 0–53)
AST: 33 U/L (ref 0–37)
Albumin: 4.3 g/dL (ref 3.5–5.2)
Alkaline Phosphatase: 64 U/L (ref 39–117)
BUN: 15 mg/dL (ref 6–23)
CO2: 23 meq/L (ref 19–32)
Calcium: 9.3 mg/dL (ref 8.4–10.5)
Chloride: 105 meq/L (ref 96–112)
Creatinine, Ser: 0.97 mg/dL (ref 0.40–1.50)
GFR: 107.16 mL/min (ref >60.00)
Glucose, Bld: 99 mg/dL (ref 70–99)
Potassium: 4 meq/L (ref 3.5–5.1)
Sodium: 138 meq/L (ref 135–145)
Total Bilirubin: 0.7 mg/dL (ref 0.2–1.2)
Total Protein: 6.9 g/dL (ref 6.0–8.3)

## 2023-07-11 LAB — HEPATITIS C ANTIBODY: Hepatitis C Ab: NONREACTIVE

## 2023-07-11 LAB — HIV ANTIBODY (ROUTINE TESTING W REFLEX): HIV 1&2 Ab, 4th Generation: NONREACTIVE

## 2023-07-11 LAB — RPR: RPR Ser Ql: NONREACTIVE

## 2023-07-17 LAB — GC/CHLAMYDIA PROBE AMP (~~LOC~~) NOT AT ARMC
Chlamydia: NEGATIVE
Comment: NEGATIVE
Comment: NORMAL
Neisseria Gonorrhea: NEGATIVE

## 2023-11-12 DIAGNOSIS — M7581 Other shoulder lesions, right shoulder: Secondary | ICD-10-CM

## 2023-11-12 HISTORY — DX: Other shoulder lesions, right shoulder: M75.81

## 2023-12-15 DIAGNOSIS — M25511 Pain in right shoulder: Secondary | ICD-10-CM | POA: Diagnosis not present

## 2023-12-15 DIAGNOSIS — S4391XA Sprain of unspecified parts of right shoulder girdle, initial encounter: Secondary | ICD-10-CM | POA: Diagnosis not present

## 2023-12-15 DIAGNOSIS — S46011A Strain of muscle(s) and tendon(s) of the rotator cuff of right shoulder, initial encounter: Secondary | ICD-10-CM | POA: Diagnosis not present

## 2023-12-16 ENCOUNTER — Other Ambulatory Visit: Payer: Self-pay | Admitting: Surgery

## 2023-12-16 DIAGNOSIS — S4391XA Sprain of unspecified parts of right shoulder girdle, initial encounter: Secondary | ICD-10-CM

## 2023-12-16 DIAGNOSIS — S46011A Strain of muscle(s) and tendon(s) of the rotator cuff of right shoulder, initial encounter: Secondary | ICD-10-CM

## 2023-12-26 ENCOUNTER — Ambulatory Visit
Admission: RE | Admit: 2023-12-26 | Discharge: 2023-12-26 | Disposition: A | Discharge status: Home / Self Care | Payer: Commercial Managed Care - PPO | Source: Ambulatory Visit | Attending: Surgery | Admitting: Surgery

## 2023-12-26 DIAGNOSIS — S46011A Strain of muscle(s) and tendon(s) of the rotator cuff of right shoulder, initial encounter: Secondary | ICD-10-CM | POA: Insufficient documentation

## 2023-12-26 DIAGNOSIS — M25711 Osteophyte, right shoulder: Secondary | ICD-10-CM | POA: Diagnosis not present

## 2023-12-26 DIAGNOSIS — M129 Arthropathy, unspecified: Secondary | ICD-10-CM | POA: Diagnosis not present

## 2023-12-26 DIAGNOSIS — S4391XA Sprain of unspecified parts of right shoulder girdle, initial encounter: Secondary | ICD-10-CM | POA: Insufficient documentation

## 2023-12-26 DIAGNOSIS — M7581 Other shoulder lesions, right shoulder: Secondary | ICD-10-CM | POA: Diagnosis not present

## 2024-01-05 DIAGNOSIS — M7581 Other shoulder lesions, right shoulder: Secondary | ICD-10-CM | POA: Diagnosis not present

## 2024-01-05 DIAGNOSIS — S46011D Strain of muscle(s) and tendon(s) of the rotator cuff of right shoulder, subsequent encounter: Secondary | ICD-10-CM | POA: Diagnosis not present

## 2024-01-07 ENCOUNTER — Other Ambulatory Visit: Payer: Self-pay | Admitting: Surgery

## 2024-01-13 ENCOUNTER — Other Ambulatory Visit: Payer: Self-pay

## 2024-01-13 ENCOUNTER — Encounter
Admission: RE | Admit: 2024-01-13 | Discharge: 2024-01-13 | Disposition: A | Discharge status: Home / Self Care | Payer: Commercial Managed Care - PPO | Source: Ambulatory Visit | Attending: Surgery | Admitting: Surgery

## 2024-01-13 HISTORY — DX: Pneumonia, unspecified organism: J18.9

## 2024-01-13 HISTORY — DX: Other complications of anesthesia, initial encounter: T88.59XA

## 2024-01-13 HISTORY — DX: Headache, unspecified: R51.9

## 2024-01-13 HISTORY — DX: Strain of muscle(s) and tendon(s) of the rotator cuff of right shoulder, subsequent encounter: S46.011D

## 2024-01-13 NOTE — Patient Instructions (Addendum)
 Your procedure is scheduled on: 01/20/24 - Tuesday Report to the Registration Desk on the 1st floor of the [facility]. To find out your arrival time, please call [phone] between 1PM - 3PM on: 01/19/23 - Monday If your arrival time is 6:00 am, do not arrive before that time as the [facility] entrance doors do not open until 6:00 am.  REMEMBER: Instructions that are not followed completely may result in serious medical risk, up to and including death; or upon the discretion of your surgeon and anesthesiologist your surgery may need to be rescheduled.  Do not eat food after midnight the night before surgery.  No gum chewing or hard candies.  You may however, drink CLEAR liquids up to 2 hours before you are scheduled to arrive for your surgery. Do not drink anything within 2 hours of your scheduled arrival time.  Clear liquids include: - water  - apple juice without pulp - gatorade (not RED colors) - black coffee or tea (Do NOT add milk or creamers to the coffee or tea) Do NOT drink anything that is not on this list.  In addition, your doctor has ordered for you to drink the provided:  Ensure Pre-Surgery Clear Carbohydrate Drink  Drinking this carbohydrate drink up to two hours before surgery helps to reduce insulin resistance and improve patient outcomes. Please complete drinking 2 hours before scheduled arrival time.  One week prior to surgery: Stop Anti-inflammatories (NSAIDS) such as Advil, Aleve, Ibuprofen, Motrin, Naproxen, Naprosyn and Aspirin based products such as Excedrin, Goody's Powder, BC Powder. You may take Tylenol if needed for pain up until the day of surgery.  Stop ANY OVER THE COUNTER supplements until after surgery.   ON THE DAY OF SURGERY ONLY TAKE THESE MEDICATIONS WITH SIPS OF WATER:  NONE    No Alcohol for 24 hours before or after surgery.  No Smoking including e-cigarettes for 24 hours before surgery.  No chewable tobacco products for at least 6  hours before surgery.  No nicotine patches on the day of surgery.  Do not use any "recreational" drugs for at least a week (preferably 2 weeks) before your surgery.  Please be advised that the combination of cocaine and anesthesia may have negative outcomes, up to and including death. If you test positive for cocaine, your surgery will be cancelled.  On the morning of surgery brush your teeth with toothpaste and water, you may rinse your mouth with mouthwash if you wish. Do not swallow any toothpaste or mouthwash.  Use CHG Soap or wipes as directed on instruction sheet.  Do not wear jewelry, make-up, hairpins, clips or nail polish.  For welded (permanent) jewelry: bracelets, anklets, waist bands, etc.  Please have this removed prior to surgery.  If it is not removed, there is a chance that hospital personnel will need to cut it off on the day of surgery.  Do not wear lotions, powders, or perfumes.   Do not shave body hair from the neck down 48 hours before surgery.  Contact lenses, hearing aids and dentures may not be worn into surgery.  Do not bring valuables to the hospital. [facility] is not responsible for any missing/lost belongings or valuables.   Notify your doctor if there is any change in your medical condition (cold, fever, infection).  Wear comfortable clothing (specific to your surgery type) to the hospital.  After surgery, you can help prevent lung complications by doing breathing exercises.  Take deep breaths and cough every  1-2 hours. Your doctor may order a device called an Incentive Spirometer to help you take deep breaths. When coughing or sneezing, hold a pillow firmly against your incision with both hands. This is called "splinting." Doing this helps protect your incision. It also decreases belly discomfort.  If you are being admitted to the hospital overnight, leave your suitcase in the car. After surgery it may be brought to your room.  In case of increased  patient census, it may be necessary for you, the patient, to continue your postoperative care in the Same Day Surgery department.  If you are being discharged the day of surgery, you will not be allowed to drive home. You will need a responsible individual to drive you home and stay with you for 24 hours after surgery.   If you are taking public transportation, you will need to have a responsible individual with you.  Please call the Pre-admissions Testing Dept. at [phone] if you have any questions about these instructions.  Surgery Visitation Policy:  Patients having surgery or a procedure may have two visitors.  Children under the age of 37 must have an adult with them who is not the patient.  Temporary Visitor Restrictions Due to increasing cases of flu, RSV and COVID-19: Children ages 65 and under will not be able to visit patients in [facility] hospitals under most circumstances.  Inpatient Visitation:    Visiting hours are 7 a.m. to 8 p.m. Up to four visitors are allowed at one time in a patient room. The visitors may rotate out with other people during the day.  One visitor age 40 or older may stay with the patient overnight and must be in the room by 8 p.m.   How to Use an Incentive Spirometer  An incentive spirometer is a tool that measures how well you are filling your lungs with each breath. Learning to take long, deep breaths using this tool can help you keep your lungs clear and active. This may help to reverse or lessen your chance of developing breathing (pulmonary) problems, especially infection. You may be asked to use a spirometer: After a surgery. If you have a lung problem or a history of smoking. After a long period of time when you have been unable to move or be active. If the spirometer includes an indicator to show the highest number that you have reached, your health care provider or respiratory therapist will help you set a goal. Keep a log of your  progress as told by your health care provider. What are the risks? Breathing too quickly may cause dizziness or cause you to pass out. Take your time so you do not get dizzy or light-headed. If you are in pain, you may need to take pain medicine before doing incentive spirometry. It is harder to take a deep breath if you are having pain. How to use your incentive spirometer  Sit up on the edge of your bed or on a chair. Hold the incentive spirometer so that it is in an upright position. Before you use the spirometer, breathe out normally. Place the mouthpiece in your mouth. Make sure your lips are closed tightly around it. Breathe in slowly and as deeply as you can through your mouth, causing the piston or the ball to rise toward the top of the chamber. Hold your breath for 3-5 seconds, or for as long as possible. If the spirometer includes a coach indicator, use this to guide you in breathing.  Slow down your breathing if the indicator goes above the marked areas. Remove the mouthpiece from your mouth and breathe out normally. The piston or ball will return to the bottom of the chamber. Rest for a few seconds, then repeat the steps 10 or more times. Take your time and take a few normal breaths between deep breaths so that you do not get dizzy or light-headed. Do this every 1-2 hours when you are awake. If the spirometer includes a goal marker to show the highest number you have reached (best effort), use this as a goal to work toward during each repetition. After each set of 10 deep breaths, cough a few times. This will help to make sure that your lungs are clear. If you have an incision on your chest or abdomen from surgery, place a pillow or a rolled-up towel firmly against the incision when you cough. This can help to reduce pain while taking deep breaths and coughing. General tips When you are able to get out of bed: Walk around often. Continue to take deep breaths and cough in order to  clear your lungs. Keep using the incentive spirometer until your health care provider says it is okay to stop using it. If you have been in the hospital, you may be told to keep using the spirometer at home. Contact a health care provider if: You are having difficulty using the spirometer. You have trouble using the spirometer as often as instructed. Your pain medicine is not giving enough relief for you to use the spirometer as told. You have a fever. Get help right away if: You develop shortness of breath. You develop a cough with bloody mucus from the lungs. You have fluid or blood coming from an incision site after you cough. Summary An incentive spirometer is a tool that can help you learn to take long, deep breaths to keep your lungs clear and active. You may be asked to use a spirometer after a surgery, if you have a lung problem or a history of smoking, or if you have been inactive for a long period of time. Use your incentive spirometer as instructed every 1-2 hours while you are awake. If you have an incision on your chest or abdomen, place a pillow or a rolled-up towel firmly against your incision when you cough. This will help to reduce pain. Get help right away if you have shortness of breath, you cough up bloody mucus, or blood comes from your incision when you cough. This information is not intended to replace advice given to you by your health care provider. Make sure you discuss any questions you have with your health care provider. Document Revised: 01/17/2020 Document Reviewed: 01/17/2020 Elsevier Patient Education  2023 [employer].

## 2024-01-19 MED ORDER — CHLORHEXIDINE GLUCONATE 0.12 % MT SOLN
15.0000 mL | Freq: Once | OROMUCOSAL | Status: AC
  Administered 2024-01-20: 15 mL via OROMUCOSAL

## 2024-01-19 MED ORDER — LACTATED RINGERS IV SOLN: INTRAVENOUS | Status: DC

## 2024-01-19 MED ORDER — ORAL CARE MOUTH RINSE: 15.0000 mL | Freq: Once | OROMUCOSAL | Status: AC

## 2024-01-20 ENCOUNTER — Ambulatory Visit: Admitting: Certified Registered"

## 2024-01-20 ENCOUNTER — Ambulatory Visit
Admission: RE | Admit: 2024-01-20 | Discharge: 2024-01-20 | Disposition: A | Discharge status: Home / Self Care | Payer: Commercial Managed Care - PPO | Attending: Surgery | Admitting: Surgery

## 2024-01-20 ENCOUNTER — Encounter: Payer: Self-pay | Admitting: Surgery

## 2024-01-20 ENCOUNTER — Other Ambulatory Visit: Payer: Self-pay

## 2024-01-20 ENCOUNTER — Ambulatory Visit

## 2024-01-20 ENCOUNTER — Encounter
Admission: RE | Disposition: A | Discharge status: Home / Self Care | Payer: Self-pay | Source: Home / Self Care | Attending: Surgery

## 2024-01-20 DIAGNOSIS — Z5986 Financial insecurity: Secondary | ICD-10-CM | POA: Diagnosis not present

## 2024-01-20 DIAGNOSIS — M25811 Other specified joint disorders, right shoulder: Secondary | ICD-10-CM | POA: Insufficient documentation

## 2024-01-20 DIAGNOSIS — Y9323 Activity, snow (alpine) (downhill) skiing, snow boarding, sledding, tobogganing and snow tubing: Secondary | ICD-10-CM | POA: Insufficient documentation

## 2024-01-20 DIAGNOSIS — G8918 Other acute postprocedural pain: Secondary | ICD-10-CM | POA: Diagnosis not present

## 2024-01-20 DIAGNOSIS — S46011A Strain of muscle(s) and tendon(s) of the rotator cuff of right shoulder, initial encounter: Secondary | ICD-10-CM | POA: Insufficient documentation

## 2024-01-20 DIAGNOSIS — M7541 Impingement syndrome of right shoulder: Secondary | ICD-10-CM | POA: Diagnosis not present

## 2024-01-20 DIAGNOSIS — M7581 Other shoulder lesions, right shoulder: Secondary | ICD-10-CM | POA: Diagnosis not present

## 2024-01-20 HISTORY — PX: SHOULDER ARTHROSCOPY WITH SUBACROMIAL DECOMPRESSION, ROTATOR CUFF REPAIR AND BICEP TENDON REPAIR: SHX5687

## 2024-01-20 SURGERY — SHOULDER ARTHROSCOPY WITH SUBACROMIAL DECOMPRESSION, ROTATOR CUFF REPAIR AND BICEP TENDON REPAIR
Anesthesia: General | Site: Shoulder | Laterality: Right

## 2024-01-20 MED ORDER — ONDANSETRON HCL 4 MG/2ML IJ SOLN: 4.0000 mg | Freq: Four times a day (QID) | INTRAMUSCULAR | Status: DC | PRN

## 2024-01-20 MED ORDER — PHENYLEPHRINE HCL-NACL 20-0.9 MG/250ML-% IV SOLN
INTRAVENOUS | Status: DC | PRN
  Administered 2024-01-20: 20 ug/min via INTRAVENOUS

## 2024-01-20 MED ORDER — CEFAZOLIN SODIUM-DEXTROSE 3-4 GM/150ML-% IV SOLN
3.0000 g | Freq: Once | INTRAVENOUS | Status: AC
  Administered 2024-01-20: 3 g via INTRAVENOUS
  Filled 2024-01-20: qty 150

## 2024-01-20 MED ORDER — LACTATED RINGERS IV SOLN
INTRAVENOUS | Status: DC | PRN
  Administered 2024-01-20: 1 mL

## 2024-01-20 MED ORDER — FENTANYL CITRATE (PF) 100 MCG/2ML IJ SOLN: 25.0000 ug | INTRAMUSCULAR | Status: DC | PRN

## 2024-01-20 MED ORDER — ROCURONIUM BROMIDE 10 MG/ML (PF) SYRINGE
PREFILLED_SYRINGE | INTRAVENOUS | Status: AC
  Filled 2024-01-20: qty 10

## 2024-01-20 MED ORDER — MIDAZOLAM HCL 2 MG/2ML IJ SOLN
1.0000 mg | INTRAMUSCULAR | Status: AC | PRN
  Administered 2024-01-20 (×2): 1 mg via INTRAVENOUS

## 2024-01-20 MED ORDER — ACETAMINOPHEN 10 MG/ML IV SOLN: 1000.0000 mg | Freq: Once | INTRAVENOUS | Status: DC | PRN

## 2024-01-20 MED ORDER — ACETAMINOPHEN 325 MG PO TABS: 325.0000 mg | ORAL_TABLET | Freq: Four times a day (QID) | ORAL | Status: DC | PRN

## 2024-01-20 MED ORDER — PHENYLEPHRINE 80 MCG/ML (10ML) SYRINGE FOR IV PUSH (FOR BLOOD PRESSURE SUPPORT)
PREFILLED_SYRINGE | INTRAVENOUS | Status: AC
  Filled 2024-01-20: qty 10

## 2024-01-20 MED ORDER — FENTANYL CITRATE (PF) 100 MCG/2ML IJ SOLN
INTRAMUSCULAR | Status: AC
Start: 2024-01-20 — End: ?
  Filled 2024-01-20: qty 2

## 2024-01-20 MED ORDER — SUGAMMADEX SODIUM 200 MG/2ML IV SOLN
INTRAVENOUS | Status: AC
  Filled 2024-01-20: qty 4

## 2024-01-20 MED ORDER — DEXMEDETOMIDINE HCL IN NACL 80 MCG/20ML IV SOLN
INTRAVENOUS | Status: DC | PRN
Start: 2024-01-20 — End: 2024-01-20
  Administered 2024-01-20 (×3): 4 ug via INTRAVENOUS
  Administered 2024-01-20: 8 ug via INTRAVENOUS
  Administered 2024-01-20: 4 ug via INTRAVENOUS

## 2024-01-20 MED ORDER — BUPIVACAINE LIPOSOME 1.3 % IJ SUSP
INTRAMUSCULAR | Status: DC | PRN
Start: 2024-01-20 — End: 2024-01-20
  Administered 2024-01-20: 20 mL

## 2024-01-20 MED ORDER — OXYCODONE HCL 5 MG/5ML PO SOLN: 5.0000 mg | Freq: Once | ORAL | Status: DC | PRN

## 2024-01-20 MED ORDER — MIDAZOLAM HCL 2 MG/2ML IJ SOLN
INTRAMUSCULAR | Status: AC
  Filled 2024-01-20: qty 2

## 2024-01-20 MED ORDER — FENTANYL CITRATE (PF) 100 MCG/2ML IJ SOLN
INTRAMUSCULAR | Status: AC
  Filled 2024-01-20: qty 2

## 2024-01-20 MED ORDER — PHENYLEPHRINE 80 MCG/ML (10ML) SYRINGE FOR IV PUSH (FOR BLOOD PRESSURE SUPPORT)
PREFILLED_SYRINGE | INTRAVENOUS | Status: DC | PRN
  Administered 2024-01-20: 160 ug via INTRAVENOUS
  Administered 2024-01-20 (×2): 80 ug via INTRAVENOUS

## 2024-01-20 MED ORDER — DEXAMETHASONE SODIUM PHOSPHATE 10 MG/ML IJ SOLN
INTRAMUSCULAR | Status: AC
  Filled 2024-01-20: qty 1

## 2024-01-20 MED ORDER — FENTANYL CITRATE (PF) 100 MCG/2ML IJ SOLN
INTRAMUSCULAR | Status: DC | PRN
  Administered 2024-01-20 (×2): 50 ug via INTRAVENOUS
  Administered 2024-01-20: 25 ug via INTRAVENOUS

## 2024-01-20 MED ORDER — BUPIVACAINE-EPINEPHRINE (PF) 0.5% -1:200000 IJ SOLN
INTRAMUSCULAR | Status: AC
  Filled 2024-01-20: qty 30

## 2024-01-20 MED ORDER — DROPERIDOL 2.5 MG/ML IJ SOLN: 0.6250 mg | Freq: Once | INTRAMUSCULAR | Status: DC | PRN

## 2024-01-20 MED ORDER — ACETAMINOPHEN 10 MG/ML IV SOLN
INTRAVENOUS | Status: AC
  Filled 2024-01-20: qty 100

## 2024-01-20 MED ORDER — ACETAMINOPHEN 10 MG/ML IV SOLN
INTRAVENOUS | Status: DC | PRN
Start: 2024-01-20 — End: 2024-01-20
  Administered 2024-01-20: 1000 mg via INTRAVENOUS

## 2024-01-20 MED ORDER — EPINEPHRINE PF 1 MG/ML IJ SOLN
INTRAMUSCULAR | Status: AC
  Filled 2024-01-20: qty 1

## 2024-01-20 MED ORDER — LIDOCAINE HCL (PF) 1 % IJ SOLN
INTRAMUSCULAR | Status: AC
  Filled 2024-01-20: qty 5

## 2024-01-20 MED ORDER — LIDOCAINE HCL (CARDIAC) PF 100 MG/5ML IV SOSY
PREFILLED_SYRINGE | INTRAVENOUS | Status: DC | PRN
  Administered 2024-01-20: 60 mg via INTRAVENOUS

## 2024-01-20 MED ORDER — PROPOFOL 10 MG/ML IV BOLUS
INTRAVENOUS | Status: AC
  Filled 2024-01-20: qty 20

## 2024-01-20 MED ORDER — PHENYLEPHRINE HCL-NACL 20-0.9 MG/250ML-% IV SOLN
INTRAVENOUS | Status: AC
  Filled 2024-01-20: qty 250

## 2024-01-20 MED ORDER — BUPIVACAINE HCL (PF) 0.5 % IJ SOLN
INTRAMUSCULAR | Status: AC
  Filled 2024-01-20: qty 10

## 2024-01-20 MED ORDER — OXYCODONE HCL 5 MG PO TABS: 5.0000 mg | ORAL_TABLET | ORAL | 0 refills | Status: DC | PRN

## 2024-01-20 MED ORDER — BUPIVACAINE HCL (PF) 0.5 % IJ SOLN
INTRAMUSCULAR | Status: DC | PRN
  Administered 2024-01-20: 10 mL

## 2024-01-20 MED ORDER — ONDANSETRON HCL 4 MG/2ML IJ SOLN
INTRAMUSCULAR | Status: AC
  Filled 2024-01-20: qty 2

## 2024-01-20 MED ORDER — RINGERS IRRIGATION IR SOLN
Status: DC | PRN
  Administered 2024-01-20: 4000 mL

## 2024-01-20 MED ORDER — OXYCODONE HCL 5 MG PO TABS: 5.0000 mg | ORAL_TABLET | Freq: Once | ORAL | Status: DC | PRN

## 2024-01-20 MED ORDER — ONDANSETRON HCL 4 MG/2ML IJ SOLN
INTRAMUSCULAR | Status: DC | PRN
  Administered 2024-01-20: 4 mg via INTRAVENOUS

## 2024-01-20 MED ORDER — ROCURONIUM BROMIDE 100 MG/10ML IV SOLN
INTRAVENOUS | Status: DC | PRN
  Administered 2024-01-20: 70 mg via INTRAVENOUS

## 2024-01-20 MED ORDER — DEXTROSE-SODIUM CHLORIDE 5-0.9 % IV SOLN: INTRAVENOUS | Status: DC

## 2024-01-20 MED ORDER — BUPIVACAINE LIPOSOME 1.3 % IJ SUSP
INTRAMUSCULAR | Status: AC
  Filled 2024-01-20: qty 20

## 2024-01-20 MED ORDER — BUPIVACAINE-EPINEPHRINE 0.5% -1:200000 IJ SOLN
INTRAMUSCULAR | Status: DC | PRN
  Administered 2024-01-20: 30 mL

## 2024-01-20 MED ORDER — OXYCODONE HCL 5 MG PO TABS
5.0000 mg | ORAL_TABLET | ORAL | 0 refills | Status: DC | PRN
  Filled 2024-01-20: qty 40, 3d supply, fill #0

## 2024-01-20 MED ORDER — METOCLOPRAMIDE HCL 5 MG/ML IJ SOLN: 5.0000 mg | Freq: Three times a day (TID) | INTRAMUSCULAR | Status: DC | PRN

## 2024-01-20 MED ORDER — GLYCOPYRROLATE 0.2 MG/ML IJ SOLN
INTRAMUSCULAR | Status: AC
Start: 2024-01-20 — End: ?
  Filled 2024-01-20: qty 1

## 2024-01-20 MED ORDER — OXYCODONE HCL 5 MG PO TABS: 5.0000 mg | ORAL_TABLET | ORAL | Status: DC | PRN

## 2024-01-20 MED ORDER — SUGAMMADEX SODIUM 200 MG/2ML IV SOLN
INTRAVENOUS | Status: DC | PRN
  Administered 2024-01-20: 300 mg via INTRAVENOUS

## 2024-01-20 MED ORDER — CHLORHEXIDINE GLUCONATE 0.12 % MT SOLN
OROMUCOSAL | Status: AC
  Filled 2024-01-20: qty 15

## 2024-01-20 MED ORDER — KETOROLAC TROMETHAMINE 30 MG/ML IJ SOLN
30.0000 mg | Freq: Once | INTRAMUSCULAR | Status: AC
  Administered 2024-01-20: 30 mg via INTRAVENOUS

## 2024-01-20 MED ORDER — MIDAZOLAM HCL 2 MG/2ML IJ SOLN
INTRAMUSCULAR | Status: DC | PRN
  Administered 2024-01-20: 2 mg via INTRAVENOUS

## 2024-01-20 MED ORDER — KETOROLAC TROMETHAMINE 30 MG/ML IJ SOLN
INTRAMUSCULAR | Status: AC
  Filled 2024-01-20: qty 1

## 2024-01-20 MED ORDER — FENTANYL CITRATE PF 50 MCG/ML IJ SOSY
50.0000 ug | PREFILLED_SYRINGE | Freq: Once | INTRAMUSCULAR | Status: AC
  Administered 2024-01-20: 25 ug via INTRAVENOUS

## 2024-01-20 MED ORDER — DEXAMETHASONE SODIUM PHOSPHATE 10 MG/ML IJ SOLN
INTRAMUSCULAR | Status: DC | PRN
  Administered 2024-01-20: 10 mg via INTRAVENOUS

## 2024-01-20 MED ORDER — GLYCOPYRROLATE 0.2 MG/ML IJ SOLN
INTRAMUSCULAR | Status: DC | PRN
  Administered 2024-01-20: .2 mg via INTRAVENOUS

## 2024-01-20 MED ORDER — PROPOFOL 10 MG/ML IV BOLUS
INTRAVENOUS | Status: DC | PRN
  Administered 2024-01-20: 250 mg via INTRAVENOUS

## 2024-01-20 MED ORDER — SODIUM CHLORIDE (PF) 0.9 % IJ SOLN
INTRAMUSCULAR | Status: AC
  Filled 2024-01-20: qty 10

## 2024-01-20 MED ORDER — ONDANSETRON HCL 4 MG PO TABS: 4.0000 mg | ORAL_TABLET | Freq: Four times a day (QID) | ORAL | Status: DC | PRN

## 2024-01-20 MED ORDER — FENTANYL CITRATE PF 50 MCG/ML IJ SOSY
PREFILLED_SYRINGE | INTRAMUSCULAR | Status: AC
Start: 2024-01-20 — End: ?
  Filled 2024-01-20: qty 1

## 2024-01-20 MED ORDER — MIDAZOLAM HCL 2 MG/2ML IJ SOLN
INTRAMUSCULAR | Status: AC
Start: 2024-01-20 — End: ?
  Filled 2024-01-20: qty 2

## 2024-01-20 MED ORDER — METOCLOPRAMIDE HCL 10 MG PO TABS: 5.0000 mg | ORAL_TABLET | Freq: Three times a day (TID) | ORAL | Status: DC | PRN

## 2024-01-20 SURGICAL SUPPLY — 47 items
ANCHOR HEALICOIL REGEN 5.5 (Anchor) IMPLANT
ANCHOR QFIX 2.8 SUT MINI TAPE (Anchor) IMPLANT
BIT DRILL JUGRKNT W/NDL BIT2.9 (DRILL) ×1 IMPLANT
BLADE FULL RADIUS 3.5 (BLADE) ×1 IMPLANT
BUR ACROMIONIZER 4.0 (BURR) ×1 IMPLANT
CHLORAPREP W/TINT 26 (MISCELLANEOUS) ×1 IMPLANT
COOLER POLAR GLACIER W/PUMP (MISCELLANEOUS) IMPLANT
COVER MAYO STAND STRL (DRAPES) ×1 IMPLANT
DILATOR 5.5 THREADED HEALICOIL (MISCELLANEOUS) IMPLANT
DRILL JUGGERKNOT W/NDL BIT 2.9 (DRILL) IMPLANT
ELECT CAUTERY BLADE 6.4 (BLADE) ×1 IMPLANT
ELECT REM PT RETURN 9FT ADLT (ELECTROSURGICAL) ×1 IMPLANT
ELECTRODE REM PT RTRN 9FT ADLT (ELECTROSURGICAL) ×1 IMPLANT
GAUZE SPONGE 4X4 12PLY STRL (GAUZE/BANDAGES/DRESSINGS) ×1 IMPLANT
GAUZE XEROFORM 1X8 LF (GAUZE/BANDAGES/DRESSINGS) ×1 IMPLANT
GLOVE BIO SURGEON STRL SZ7.5 (GLOVE) ×2 IMPLANT
GLOVE BIO SURGEON STRL SZ8 (GLOVE) ×2 IMPLANT
GLOVE BIOGEL PI IND STRL 8 (GLOVE) ×1 IMPLANT
GLOVE INDICATOR 8.0 STRL GRN (GLOVE) ×1 IMPLANT
GOWN STRL REUS W/ TWL LRG LVL3 (GOWN DISPOSABLE) ×1 IMPLANT
GOWN STRL REUS W/ TWL XL LVL3 (GOWN DISPOSABLE) ×1 IMPLANT
GRASPER SUT 15 45D LOW PRO (SUTURE) IMPLANT
IV LR IRRIG 3000ML ARTHROMATIC (IV SOLUTION) ×2 IMPLANT
KIT CANNULA 8X76-LX IN CANNULA (CANNULA) ×1 IMPLANT
KIT SUTURE 2.8 Q-FIX DISP (MISCELLANEOUS) IMPLANT
MANIFOLD NEPTUNE II (INSTRUMENTS) ×1 IMPLANT
MASK FACE SPIDER DISP (MASK) ×1 IMPLANT
MAT ABSORB FLUID 56X50 GRAY (MISCELLANEOUS) ×1 IMPLANT
PACK ARTHROSCOPY SHOULDER (MISCELLANEOUS) ×1 IMPLANT
PAD ABD DERMACEA PRESS 5X9 (GAUZE/BANDAGES/DRESSINGS) ×2 IMPLANT
PAD WRAPON POLAR SHDR XLG (MISCELLANEOUS) IMPLANT
PASSER SUT FIRSTPASS SELF (INSTRUMENTS) IMPLANT
SLING ARM LRG DEEP (SOFTGOODS) ×1 IMPLANT
SLING ULTRA II LG (MISCELLANEOUS) ×1 IMPLANT
SPONGE T-LAP 18X18 ~~LOC~~+RFID (SPONGE) ×1 IMPLANT
STAPLER SKIN PROX 35W (STAPLE) ×1 IMPLANT
STRAP SAFETY 5IN WIDE (MISCELLANEOUS) ×1 IMPLANT
SUT ETHIBOND 0 MO6 C/R (SUTURE) ×1 IMPLANT
SUT ULTRABRAID 2 COBRAID 38 (SUTURE) IMPLANT
SUT VIC AB 2-0 CT1 TAPERPNT 27 (SUTURE) ×2 IMPLANT
TAPE MICROFOAM 4IN (TAPE) ×1 IMPLANT
TRAP FLUID SMOKE EVACUATOR (MISCELLANEOUS) ×1 IMPLANT
TUBE SET DOUBLEFLO INFLOW (TUBING) ×1 IMPLANT
TUBING CONNECTING 10 (TUBING) ×1 IMPLANT
WAND WEREWOLF FLOW 90D (MISCELLANEOUS) ×1 IMPLANT
WATER STERILE IRR 500ML POUR (IV SOLUTION) ×1 IMPLANT
WRAPON POLAR PAD SHDR XLG (MISCELLANEOUS) ×1 IMPLANT

## 2024-01-20 NOTE — Op Note (Addendum)
 01/20/2024  2:28 PM  Patient:   Nicholas Jensen  Pre-Op Diagnosis:   Traumatic full-thickness rotator cuff tear, right shoulder.  Post-Op Diagnosis:   Impingement/tendinopathy with traumatic full-thickness rotator cuff tear, right shoulder.  Procedure:   Limited arthroscopic debridement, arthroscopic subacromial decompression, and mini-open rotator cuff repair, right shoulder.  Anesthesia:   General endotracheal with interscalene block using Exparel placed preoperatively by the anesthesiologist.  Surgeon:   Maryagnes Amos, MD  Assistant:   Horris Latino, PA-C  Findings:   As above.  There was a full-thickness tear involving the entire supraspinatus tendon with mild retraction.  The remainder of the rotator cuff was in satisfactory condition.  The labrum and biceps tendon both were in satisfactory condition, as were the articular surfaces of the glenoid and humerus.  Complications:   None  Estimated blood loss:   20 cc  Fluids:   600 cc  Tourniquet time:   None  Drains:   None  Closure:   Staples      Brief clinical note:   The patient is a 28 year old male who sustained the above-noted injury while snowboarding about 6 weeks ago. The patient's symptoms have progressed despite medications, activity modification, etc. The patient's history and examination are consistent with a traumatic rotator cuff tear. These findings were confirmed by MRI scan. The patient presents at this time for definitive management of these shoulder symptoms.  Procedure:   The patient underwent placement of an interscalene block by the anesthesiologist using Exparel in the preoperative holding area before being brought into the operating room and lain in the supine position. The patient then underwent general endotracheal intubation and anesthesia before being repositioned in the beach chair position using the beach chair positioner. The right shoulder and upper extremity were prepped with ChloraPrep solution  before being draped sterilely. Preoperative antibiotics were administered. A timeout was performed to confirm the proper surgical site before the expected portal sites and incision site were injected with 0.5% Sensorcaine with epinephrine.   A posterior portal was created and the glenohumeral joint thoroughly inspected with the findings as described above. An anterior portal was created using an outside-in technique. The labrum and rotator cuff were further probed, again confirming the above-noted findings. The torn margin of the rotator cuff tear was debrided back to stable margins using the full-radius resector, as were areas of synovitis. There also was a small area of labral fraying at the 3 o'clock position which also was debrided back to stable margins using the full-radius resector. The ArthroCare wand was inserted and used to obtain hemostasis as well as to "anneal" the labrum anteriorly. The instruments were removed from the joint after suctioning the excess fluid.  The camera was repositioned through the posterior portal into the subacromial space. A separate lateral portal was created using an outside-in technique. The 3.5 mm full-radius resector was introduced and used to perform a subtotal bursectomy. The ArthroCare wand was then inserted and used to remove the periosteal tissue off the undersurface of the anterior third of the acromion as well as to recess the coracoacromial ligament from its attachment along the anterior and lateral margins of the acromion. The 4.0 mm acromionizing bur was introduced and used to complete the decompression by removing the undersurface of the anterior third of the acromion. The full radius resector was reintroduced to remove any residual bony debris before the ArthroCare wand was reintroduced to obtain hemostasis. The instruments were then removed from the subacromial space after suctioning  the excess fluid.  An approximately 4-5 cm incision was made over the  anterolateral aspect of the shoulder beginning at the anterolateral corner of the acromion and extending distally in line with the bicipital groove. This incision was carried down through the subcutaneous tissues to expose the deltoid fascia. The raphae between the anterior and middle thirds was identified and this plane developed to provide access into the subacromial space. Additional bursal tissues were debrided sharply using Metzenbaum scissors. The rotator cuff tear was readily identified.   The margins of the rotator cuff tear were debrided sharply with a #15 blade and the exposed greater tuberosity roughened with a rongeur. The tear was repaired using two Smith [employer] Nephew 2.8 mm Q-Fix anchors. These sutures were then brought back laterally and secured using two Smith & Nephew Healicoil knotless RegeneSorb anchors to create a two-layer closure. An apparent watertight closure was obtained.  The wound was copiously irrigated with sterile saline solution before the deltoid raphae was reapproximated using 2-0 Vicryl interrupted sutures. The subcutaneous tissues were closed in two layers using 2-0 Vicryl interrupted sutures before the skin was closed using staples. The portal sites also were closed using staples. A sterile bulky dressing was applied to the shoulder before a Polar Care device was applied to the shoulder. The arm also was placed into a shoulder immobilizer. The patient was then awakened, extubated, and returned to the recovery room in satisfactory condition after tolerating the procedure well.

## 2024-01-20 NOTE — Discharge Instructions (Addendum)
 Orthopedic discharge instructions: Keep dressing dry and intact.  May shower after dressing changed on post-op day #4 (Saturday).  Cover staples with Band-Aids after drying off. Apply ice frequently to shoulder or use Polar Care device. Take ibuprofen 800 mg TID with meals for 3-5 days, then as necessary. Take oxycodone as prescribed when needed.  May supplement with ES Tylenol if necessary. Take aspirin 325 mg (or 4 baby aspirin) daily for 4 weeks Keep shoulder immobilizer on at all times except may remove for bathing purposes. Follow-up in 10-14 days or as scheduled.SHOULDER SLING IMMOBILIZER   VIDEO Slingshot 2 Shoulder Brace Application - YouTube ---https://www.porter.info/  INSTRUCTIONS While supporting the injured arm, slide the forearm into the sling. Wrap the adjustable shoulder strap around the neck and shoulders and attach the strap end to the sling using  the "alligator strap tab."  Adjust the shoulder strap to the required length. Position the shoulder pad behind the neck. To secure the shoulder pad location (optional), pull the shoulder strap away from the shoulder pad, unfold the hook material on the top of the pad, then press the shoulder strap back onto the hook material to secure the pad in place. Attach the closure strap across the open top of the sling. Position the strap so that it holds the arm securely in the sling. Next, attach the thumb strap to the open end of the sling between the thumb and fingers. After sling has been fit, it may be easily removed and reapplied using the quick release buckle on shoulder strap. If a neutral pillow or 15 abduction pillow is included, place the pillow at the waistline. Attach the sling to the pillow, lining up hook material on the pillow with the loop on sling. Adjust the waist strap to fit.  If waist strap is too long, cut it to fit. Use the small piece of double sided hook material (located on top of the pillow)  to secure the strap end. Place the double sided hook material on the inside of the cut strap end and secure it to the waist strap.     If no pillow is included, attach the waist strap to the sling and adjust to fit.    Washing Instructions: Straps and sling must be removed and cleaned regularly depending on your activity level and perspiration. Hand wash straps and sling in cold water with mild detergent, rinse, air dry   POLAR CARE INFORMATION  MassAdvertisement.it  How to use [employer] Polar Care [facility] Therapy System?  YouTube   ShippingScam.co.uk  OPERATING INSTRUCTIONS  Start the product With dry hands, connect the transformer to the electrical connection located on the top of the cooler. Next, plug the transformer into an appropriate electrical outlet. The unit will automatically start running at this point.  To stop the pump, disconnect electrical power.  Unplug to stop the product when not in use. Unplugging the Polar Care unit turns it off. Always unplug immediately after use. Never leave it plugged in while unattended. Remove pad.    FIRST ADD WATER TO FILL LINE, THEN ICE---Replace ice when existing ice is almost melted  1 Discuss Treatment with your Licensed Health Care Practitioner and Use Only as Prescribed 2 Apply Insulation Barrier & Cold Therapy Pad 3 Check for Moisture 4 Inspect Skin Regularly  Tips and Trouble Shooting Usage Tips 1. Use cubed or chunked ice for optimal performance. 2. It is recommended to drain the Pad between uses. To drain the pad, hold the Pad  upright with the hose pointed toward the ground. Depress the black plunger and allow water to drain out. 3. You may disconnect the Pad from the unit without removing the pad from the affected area by depressing the silver tabs on the hose coupling and gently pulling the hoses apart. The Pad and unit will seal itself and will not leak. Note: Some dripping during release is normal. 4. DO NOT  RUN PUMP WITHOUT WATER! The pump in this unit is designed to run with water. Running the unit without water will cause permanent damage to the pump. 5. Unplug unit before removing lid.  TROUBLESHOOTING GUIDE Pump not running, Water not flowing to the pad, Pad is not getting cold 1. Make sure the transformer is plugged into the wall outlet. 2. Confirm that the ice and water are filled to the indicated levels. 3. Make sure there are no kinks in the pad. 4. Gently pull on the blue tube to make sure the tube/pad junction is straight. 5. Remove the pad from the treatment site and ll it while the pad is lying at; then reapply. 6. Confirm that the pad couplings are securely attached to the unit. Listen for the double clicks (Figure 1) to confirm the pad couplings are securely attached.  Leaks    Note: Some condensation on the lines, controller, and pads is unavoidable, especially in warmer climates. 1. If using a Breg Polar Care Cold Therapy unit with a detachable Cold Therapy Pad, and a leak exists (other than condensation on the lines) disconnect the pad couplings. Make sure the silver tabs on the couplings are depressed before reconnecting the pad to the pump hose; then confirm both sides of the coupling are properly clicked in. 2. If the coupling continues to leak or a leak is detected in the pad itself, stop using it and call [employer] Customer Care at [phone].  Cleaning After use, empty and dry the unit with a soft cloth. Warm water and mild detergent may be used occasionally to clean the pump and tubes.  WARNING: The Polar Care Cube can be cold enough to cause serious injury, including full skin necrosis. Follow these Operating Instructions, and carefully read the Product Insert (see pouch on side of unit) and the Cold Therapy Pad Fitting Instructions (provided with each Cold Therapy Pad) prior to use.

## 2024-01-20 NOTE — Anesthesia Procedure Notes (Signed)
 Procedure Name: Intubation Date/Time: 01/20/2024 1:06 PM  Performed by: Omer Jack, CRNAPre-anesthesia Checklist: Patient identified, Patient being monitored, Timeout performed, Emergency Drugs available and Suction available Patient Re-evaluated:Patient Re-evaluated prior to induction Oxygen Delivery Method: Circle system utilized Preoxygenation: Pre-oxygenation with 100% oxygen Induction Type: IV induction Ventilation: Mask ventilation without difficulty Laryngoscope Size: 4 and McGrath Grade View: Grade I Tube type: Oral Tube size: 7.5 mm Number of attempts: 1 Airway Equipment and Method: Stylet Placement Confirmation: ETT inserted through vocal cords under direct vision, positive ETCO2 and breath sounds checked- equal and bilateral Secured at: 23 cm Tube secured with: Tape Dental Injury: Teeth and Oropharynx as per pre-operative assessment

## 2024-01-20 NOTE — Anesthesia Preprocedure Evaluation (Signed)
 Anesthesia Evaluation  Patient identified by MRN, date of birth, ID band Patient awake    Reviewed: Allergy & Precautions, H&P , NPO status , Patient's Chart, lab work & pertinent test results, reviewed documented beta blocker date and time   History of Anesthesia Complications (+) history of anesthetic complications  Airway Mallampati: II  TM Distance: >3 FB Neck ROM: full    Dental  (+) Teeth Intact   Pulmonary pneumonia   Pulmonary exam normal        Cardiovascular Exercise Tolerance: Good negative cardio ROS Normal cardiovascular exam Rhythm:regular Rate:Normal     Neuro/Psych  Headaches  negative psych ROS   GI/Hepatic negative GI ROS, Neg liver ROS,,,  Endo/Other  negative endocrine ROS    Renal/GU negative Renal ROS  negative genitourinary   Musculoskeletal   Abdominal   Peds  Hematology negative hematology ROS (+)   Anesthesia Other Findings Past Medical History: 01/17/2010: Closed fracture of metacarpal bone     Comment:  Fracture Of The Metacarpal Bones      10/1 IMO update No date: Complication of anesthesia     Comment:  WOKE UP DURING DENTAL PROCEDURE WHEN 28 YEARS OLD No date: Headache     Comment:  MIGRAINES 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      10/1 IMO update   02/13/2017: Left ankle pain 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 No date: Pneumonia 05/15/2012: Right shoulder injury 2025: Rotator cuff tendinitis, right No date: Traumatic complete tear of right rotator cuff, subsequent  encounter Past Surgical History: No date: HAND SURGERY     Comment:  right No date: HAND SURGERY No date: OTHER SURGICAL HISTORY     Comment:  Vocal cord lesion excision No date: wisdom teeth excision BMI    Body Mass Index: 35.78 kg/m     Reproductive/Obstetrics negative OB ROS                             Anesthesia  Physical Anesthesia Plan  ASA: 2  Anesthesia Plan: General ETT   Post-op Pain Management: Regional block*   Induction:   PONV Risk Score and Plan: 3  Airway Management Planned:   Additional Equipment:   Intra-op Plan:   Post-operative Plan:   Informed Consent: I have reviewed the patients History and Physical, chart, labs and discussed the procedure including the risks, benefits and alternatives for the proposed anesthesia with the patient or authorized representative who has indicated his/her understanding and acceptance.     Dental Advisory Given  Plan Discussed with: CRNA  Anesthesia Plan Comments:        Anesthesia Quick Evaluation

## 2024-01-20 NOTE — H&P (Signed)
 History of Present Illness:  Nicholas Jensen is a 28 y.o. male who presents for follow-up of his right shoulder pain following a snowboard injury 3 weeks ago. The patient notes that his shoulder pain has improved since his initial visit 3 weeks ago. He continues to experience some discomfort in his shoulder which he rates at 2/10 on today's visit. He has been wearing his sling on a regular basis, removing it for bathing purposes and for some occasional light activities around the house. In addition, he continues to perform light duties at work and is tolerating this well. He still notes moderate pain if he tries to lift his arm away from his body and has pain at night if he accidentally rolls onto his right side. However, he denies any reinjury to the shoulder, and denies any numbness paresthesias down his arm to his hand. Since his last visit, he has undergone an MRI scan and presents today to review these results.  Allergies: No Known Allergies  Past Medical History: No past medical history on file.  Past Surgical History: No pertinent surgical history.   Past Family History: No family history on file.   Social History:   Socioeconomic History:  Marital status: Single  Tobacco Use  Smoking status: Never  Smokeless tobacco: Never  Vaping Use  Vaping status: Never Used  Substance and Sexual Activity  Alcohol use: Defer  Drug use: Never  Sexual activity: Defer   Social Drivers of Health:   Financial Resource Strain: Medium Risk (02/16/2021)  Received from [facility]  Overall Financial Resource Strain (CARDIA)  Difficulty of Paying Living Expenses: Somewhat hard  Food Insecurity: No Food Insecurity (02/16/2021)  Received from [facility]  Hunger Vital Sign  Worried About Running Out of Food in the Last Year: Never true  Ran Out of Food in the Last Year: Never true  Transportation Needs: No Transportation Needs (02/16/2021)  Received from [facility]   Lack of Transportation (Medical): No  Lack of Transportation (Non-Medical): No   Review of Systems:  A comprehensive 14 point ROS was performed, reviewed, and the pertinent orthopaedic findings are documented in the HPI.  Physical Exam: Vitals:  01/05/24 0820 01/05/24 0821  BP: 130/74  Weight: (!) 138.3 kg (305 lb)  Height: 198.1 cm (6\' 6" )  PainSc: 2 2  PainLoc: Shoulder Shoulder   General/Constitutional: The patient appears to be well-nourished, well-developed, and in no acute distress. Neuro/Psych: Normal mood and affect, oriented to person, place and time. Eyes: Non-icteric. Pupils are equal, round, and reactive to light, and exhibit synchronous movement. ENT: Unremarkable. Lymphatic: No palpable adenopathy. Respiratory: Lungs clear to auscultation, Normal chest excursion, No wheezes, and Non-labored breathing Cardiovascular: Regular rate and rhythm. No murmurs. and No edema, swelling or tenderness, except as noted in detailed exam. Integumentary: No impressive skin lesions present, except as noted in detailed exam. Musculoskeletal: Unremarkable, except as noted in detailed exam.  Right shoulder exam: SKIN: Normal SWELLING: None WARMTH: None LYMPH NODES: No adenopathy palpable CREPITUS: None TENDERNESS: Mildly tender over anterolateral shoulder ROM (active):  Forward flexion: 55 degrees Abduction: 40 degrees Internal rotation: Right buttock ROM (passive):  Forward flexion: 70 degrees Abduction: 50 degrees ER/IR at 90 abd: Not evaluated  He experiences moderate pain with all motions.  STRENGTH: Forward flexion: 3/5 Abduction: 3/5 External rotation: 4/5 Internal rotation: 4+-5/5 Pain with RC testing: Moderate pain with resisted forward flexion and abduction and mild pain with resisted external rotation  STABILITY: Normal  SPECIAL TESTS: Juanetta Gosling' test: Not evaluated Speed's test: Not evaluated Capsulitis - pain w/ passive ER: No Crossed arm test: Not  evaluated Crank: Not evaluated Anterior apprehension: Negative Posterior apprehension: Not evaluated  He remains neurovascularly intact to the right upper extremity.  Imaging:  A recent MRI scan of the right shoulder is available for review and has been reviewed by myself. By report, the study demonstrates evidence of a full-thickness tear involving the supraspinatus tendon with 1.3 cm of retraction. Moderate tendinopathy is noted involving both the subscapularis and infraspinatus tendons as well. The biceps tendon and labrum appear to be in satisfactory condition. No significant degenerative changes are identified. Both the films and report were reviewed by myself and discussed with the patient.  Assessment: 1. Traumatic complete tear of right rotator cuff.  2. Rotator cuff tendinitis, right.   Plan: The treatment options were discussed with the patient. In addition, patient educational materials were provided regarding the diagnosis and treatment options. The patient is quite frustrated by his symptoms and functional limitations, and is ready to consider more aggressive treatment options. Therefore, I have recommended a surgical procedure, specifically a right shoulder arthroscopy with debridement, decompression, and rotator cuff repair. The procedure was discussed with the patient, as were the potential risks (including bleeding, infection, nerve and/or blood vessel injury, persistent or recurrent pain, failure of the repair, progression of arthritis, need for further surgery, blood clots, strokes, heart attacks and/or arhythmias, pneumonia, etc.) and benefits. The patient states his understanding and wishes to proceed. All of the patient's questions and concerns were answered. He can call any time with further concerns. He will follow up post-surgery, routine.    H&P reviewed and patient re-examined. No changes.

## 2024-01-20 NOTE — Transfer of Care (Signed)
 Immediate Anesthesia Transfer of Care Note  Patient: Nicholas Jensen  Procedure(s) Performed: RIGHT SHOULDER ARTHROSCOPY WITH DEBRIDEMENT, DECOMPRESSION, AND ROTATOR CUFF REPAIR. (Right: Shoulder)  Patient Location: PACU  Anesthesia Type:General  Level of Consciousness: drowsy and patient cooperative  Airway & Oxygen Therapy: Patient Spontanous Breathing and Patient connected to face mask oxygen  Post-op Assessment: Report given to RN and Post -op Vital signs reviewed and stable  Post vital signs: Reviewed and stable  Last Vitals:  Vitals Value Taken Time  BP 120/67 01/20/24 1503  Temp 36.4 C 01/20/24 1500  Pulse 76 01/20/24 1503  Resp 26 01/20/24 1503  SpO2 99 % 01/20/24 1503  Vitals shown include unfiled device data.  Last Pain:  Vitals:   01/20/24 1235  TempSrc:   PainSc: 0-No pain         Complications: No notable events documented.

## 2024-01-20 NOTE — Anesthesia Procedure Notes (Signed)
 Anesthesia Regional Block: Interscalene brachial plexus block   Pre-Anesthetic Checklist: , timeout performed,  Correct Patient, Correct Site, Correct Laterality,  Correct Procedure, Correct Position, site marked,  Risks and benefits discussed,  Surgical consent,  Pre-op evaluation,  At surgeon's request and post-op pain management  Laterality: Right  Prep: chloraprep       Needles:  Injection technique: Single-shot  Needle Type: Echogenic Needle     Needle Length: 4cm  Needle Gauge: 25     Additional Needles:   Procedures:,,,, ultrasound used (permanent image in chart),,    Narrative:  Injection made incrementally with aspirations every 5 mL.  Events: blood aspirated  Performed by: Personally  Anesthesiologist: Corinda Gubler, MD  Additional Notes: Patient's chart reviewed and they were deemed appropriate candidate for procedure, at surgeon's request. Patient educated about risks, benefits, and alternatives of the block including but not limited to: temporary or permanent nerve damage, bleeding, infection, damage to surround tissues, pneumothorax, hemidiaphragmatic paralysis, unilateral Horner's syndrome, block failure, local anesthetic toxicity. Patient expressed understanding. A formal time-out was conducted consistent with institution rules.  Monitors were applied, and minimal sedation used (see nursing record). The site was prepped with skin prep and allowed to dry, and sterile gloves were used. A high frequency linear ultrasound probe with probe cover was utilized throughout. C5-7 nerve roots located and appeared anatomically normal, local anesthetic injected around them, and echogenic block needle trajectory was monitored throughout. Aspiration performed every 5ml. Lung and blood vessels were avoided. All injections were performed without resistance and free of blood and paresthesias. The patient tolerated the procedure well.  Injectate: 20ml exparel + 10ml 0.5% bupivacaine

## 2024-01-21 ENCOUNTER — Other Ambulatory Visit: Payer: Self-pay

## 2024-01-21 ENCOUNTER — Encounter: Payer: Self-pay | Admitting: Surgery

## 2024-01-23 NOTE — Anesthesia Postprocedure Evaluation (Signed)
 Anesthesia Post Note  Patient: Nicholas Jensen  Procedure(s) Performed: RIGHT SHOULDER ARTHROSCOPY WITH DEBRIDEMENT, DECOMPRESSION, AND ROTATOR CUFF REPAIR. (Right: Shoulder)  Patient location during evaluation: PACU Anesthesia Type: General Level of consciousness: awake and alert Pain management: pain level controlled Vital Signs Assessment: post-procedure vital signs reviewed and stable Respiratory status: spontaneous breathing, nonlabored ventilation, respiratory function stable and patient connected to nasal cannula oxygen Cardiovascular status: blood pressure returned to baseline and stable Postop Assessment: no apparent nausea or vomiting Anesthetic complications: no   No notable events documented.   Last Vitals:  Vitals:   01/20/24 1545 01/20/24 1556  BP: 117/72 128/89  Pulse: 67 62  Resp: 17   Temp:  (!) 36.3 C  SpO2: 97% 98%    Last Pain:  Vitals:   01/21/24 0843  TempSrc:   PainSc: 0-No pain                 Lenard Simmer

## 2024-02-06 DIAGNOSIS — M25511 Pain in right shoulder: Secondary | ICD-10-CM | POA: Diagnosis not present

## 2024-02-06 DIAGNOSIS — M25411 Effusion, right shoulder: Secondary | ICD-10-CM | POA: Diagnosis not present

## 2024-02-11 DIAGNOSIS — M25511 Pain in right shoulder: Secondary | ICD-10-CM | POA: Diagnosis not present

## 2024-02-11 DIAGNOSIS — M25411 Effusion, right shoulder: Secondary | ICD-10-CM | POA: Diagnosis not present

## 2024-02-18 DIAGNOSIS — M25411 Effusion, right shoulder: Secondary | ICD-10-CM | POA: Diagnosis not present

## 2024-02-18 DIAGNOSIS — M25511 Pain in right shoulder: Secondary | ICD-10-CM | POA: Diagnosis not present

## 2024-02-25 DIAGNOSIS — M25511 Pain in right shoulder: Secondary | ICD-10-CM | POA: Diagnosis not present

## 2024-02-25 DIAGNOSIS — M25411 Effusion, right shoulder: Secondary | ICD-10-CM | POA: Diagnosis not present

## 2024-03-03 DIAGNOSIS — M25411 Effusion, right shoulder: Secondary | ICD-10-CM | POA: Diagnosis not present

## 2024-03-03 DIAGNOSIS — M25511 Pain in right shoulder: Secondary | ICD-10-CM | POA: Diagnosis not present

## 2024-03-10 DIAGNOSIS — M25511 Pain in right shoulder: Secondary | ICD-10-CM | POA: Diagnosis not present

## 2024-03-10 DIAGNOSIS — M25411 Effusion, right shoulder: Secondary | ICD-10-CM | POA: Diagnosis not present

## 2024-03-12 DIAGNOSIS — M25411 Effusion, right shoulder: Secondary | ICD-10-CM | POA: Diagnosis not present

## 2024-03-12 DIAGNOSIS — M25511 Pain in right shoulder: Secondary | ICD-10-CM | POA: Diagnosis not present

## 2024-03-17 DIAGNOSIS — M25511 Pain in right shoulder: Secondary | ICD-10-CM | POA: Diagnosis not present

## 2024-03-17 DIAGNOSIS — M25411 Effusion, right shoulder: Secondary | ICD-10-CM | POA: Diagnosis not present

## 2024-03-19 DIAGNOSIS — M25411 Effusion, right shoulder: Secondary | ICD-10-CM | POA: Diagnosis not present

## 2024-03-19 DIAGNOSIS — M25511 Pain in right shoulder: Secondary | ICD-10-CM | POA: Diagnosis not present

## 2024-03-24 DIAGNOSIS — M25411 Effusion, right shoulder: Secondary | ICD-10-CM | POA: Diagnosis not present

## 2024-03-24 DIAGNOSIS — M25511 Pain in right shoulder: Secondary | ICD-10-CM | POA: Diagnosis not present

## 2024-03-26 DIAGNOSIS — M25411 Effusion, right shoulder: Secondary | ICD-10-CM | POA: Diagnosis not present

## 2024-03-26 DIAGNOSIS — M25511 Pain in right shoulder: Secondary | ICD-10-CM | POA: Diagnosis not present

## 2024-03-30 DIAGNOSIS — M25411 Effusion, right shoulder: Secondary | ICD-10-CM | POA: Diagnosis not present

## 2024-03-30 DIAGNOSIS — M25511 Pain in right shoulder: Secondary | ICD-10-CM | POA: Diagnosis not present

## 2024-04-06 DIAGNOSIS — M25511 Pain in right shoulder: Secondary | ICD-10-CM | POA: Diagnosis not present

## 2024-04-06 DIAGNOSIS — M25411 Effusion, right shoulder: Secondary | ICD-10-CM | POA: Diagnosis not present

## 2024-04-08 DIAGNOSIS — M25511 Pain in right shoulder: Secondary | ICD-10-CM | POA: Diagnosis not present

## 2024-04-08 DIAGNOSIS — M25411 Effusion, right shoulder: Secondary | ICD-10-CM | POA: Diagnosis not present

## 2024-04-12 DIAGNOSIS — M25511 Pain in right shoulder: Secondary | ICD-10-CM | POA: Diagnosis not present

## 2024-04-12 DIAGNOSIS — M25411 Effusion, right shoulder: Secondary | ICD-10-CM | POA: Diagnosis not present

## 2024-04-14 DIAGNOSIS — M25511 Pain in right shoulder: Secondary | ICD-10-CM | POA: Diagnosis not present

## 2024-04-14 DIAGNOSIS — M25411 Effusion, right shoulder: Secondary | ICD-10-CM | POA: Diagnosis not present

## 2024-04-20 DIAGNOSIS — M25411 Effusion, right shoulder: Secondary | ICD-10-CM | POA: Diagnosis not present

## 2024-04-20 DIAGNOSIS — M25511 Pain in right shoulder: Secondary | ICD-10-CM | POA: Diagnosis not present

## 2024-04-22 DIAGNOSIS — M25511 Pain in right shoulder: Secondary | ICD-10-CM | POA: Diagnosis not present

## 2024-04-22 DIAGNOSIS — M25411 Effusion, right shoulder: Secondary | ICD-10-CM | POA: Diagnosis not present

## 2024-04-26 DIAGNOSIS — M25511 Pain in right shoulder: Secondary | ICD-10-CM | POA: Diagnosis not present

## 2024-04-26 DIAGNOSIS — M25411 Effusion, right shoulder: Secondary | ICD-10-CM | POA: Diagnosis not present

## 2024-04-29 DIAGNOSIS — M25511 Pain in right shoulder: Secondary | ICD-10-CM | POA: Diagnosis not present

## 2024-04-29 DIAGNOSIS — M25411 Effusion, right shoulder: Secondary | ICD-10-CM | POA: Diagnosis not present

## 2024-05-04 DIAGNOSIS — M25411 Effusion, right shoulder: Secondary | ICD-10-CM | POA: Diagnosis not present

## 2024-05-04 DIAGNOSIS — M25511 Pain in right shoulder: Secondary | ICD-10-CM | POA: Diagnosis not present

## 2024-05-06 DIAGNOSIS — M25411 Effusion, right shoulder: Secondary | ICD-10-CM | POA: Diagnosis not present

## 2024-05-06 DIAGNOSIS — M25511 Pain in right shoulder: Secondary | ICD-10-CM | POA: Diagnosis not present

## 2024-05-11 DIAGNOSIS — M25411 Effusion, right shoulder: Secondary | ICD-10-CM | POA: Diagnosis not present

## 2024-05-11 DIAGNOSIS — M25511 Pain in right shoulder: Secondary | ICD-10-CM | POA: Diagnosis not present

## 2024-05-18 DIAGNOSIS — M25511 Pain in right shoulder: Secondary | ICD-10-CM | POA: Diagnosis not present

## 2024-05-18 DIAGNOSIS — M25411 Effusion, right shoulder: Secondary | ICD-10-CM | POA: Diagnosis not present

## 2024-05-20 DIAGNOSIS — R29898 Other symptoms and signs involving the musculoskeletal system: Secondary | ICD-10-CM | POA: Diagnosis not present

## 2024-05-25 DIAGNOSIS — R29898 Other symptoms and signs involving the musculoskeletal system: Secondary | ICD-10-CM | POA: Diagnosis not present

## 2024-05-26 DIAGNOSIS — Z9889 Other specified postprocedural states: Secondary | ICD-10-CM | POA: Diagnosis not present

## 2024-05-26 DIAGNOSIS — M7581 Other shoulder lesions, right shoulder: Secondary | ICD-10-CM | POA: Diagnosis not present

## 2024-05-26 DIAGNOSIS — M25511 Pain in right shoulder: Secondary | ICD-10-CM | POA: Diagnosis not present

## 2024-05-26 DIAGNOSIS — S46011D Strain of muscle(s) and tendon(s) of the rotator cuff of right shoulder, subsequent encounter: Secondary | ICD-10-CM | POA: Diagnosis not present

## 2024-06-03 ENCOUNTER — Encounter: Payer: Self-pay | Admitting: Internal Medicine

## 2024-06-03 ENCOUNTER — Ambulatory Visit (INDEPENDENT_AMBULATORY_CARE_PROVIDER_SITE_OTHER): Admitting: Internal Medicine

## 2024-06-03 VITALS — BP 130/80 | HR 82 | Temp 98.8°F | Ht 76.5 in | Wt 308.6 lb

## 2024-06-03 DIAGNOSIS — Z113 Encounter for screening for infections with a predominantly sexual mode of transmission: Secondary | ICD-10-CM

## 2024-06-03 DIAGNOSIS — Z23 Encounter for immunization: Secondary | ICD-10-CM

## 2024-06-03 NOTE — Progress Notes (Signed)
 [facility] [address] [place] [address] Dept: [phone] Dept Fax: [phone]  Acute Care Office Visit  Subjective:   Nicholas Jensen [birth year] 06/03/2024  Chief Complaint  Patient presents with   STD Screening     HPI:  Patient presents for STD screening. He reports having 3 new male partners. He uses protection with 2 out of 3 of his partners.  No dischage or burning. No abdominal pain.   The following portions of the patient's history were reviewed and updated as appropriate: past medical history, past surgical history, family history, social history, allergies, medications, and problem list.   There are no active problems to display for this patient.  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   Pneumonia    Right shoulder injury 05/15/2012   Rotator cuff tendinitis, right 2025   Traumatic complete tear of right rotator cuff, subsequent encounter    Past Surgical History:  Procedure Laterality Date   HAND SURGERY     right   HAND SURGERY     OTHER SURGICAL HISTORY     Vocal cord lesion excision   SHOULDER ARTHROSCOPY WITH SUBACROMIAL DECOMPRESSION, ROTATOR CUFF REPAIR AND BICEP TENDON REPAIR Right 01/20/2024   Procedure: RIGHT SHOULDER ARTHROSCOPY WITH DEBRIDEMENT, DECOMPRESSION, AND ROTATOR CUFF REPAIR.;  Surgeon: Edie Norleen PARAS, MD;  Location: [facility] ORS;  Service: Orthopedics;  Laterality: Right;   wisdom teeth excision     History reviewed. No pertinent family history.  Current Outpatient Medications:    oxyCODONE  (OXY  IR/ROXICODONE ) 5 MG immediate release tablet, Take 1-2 tablets (5-10 mg total) by mouth every 4 (four) hours as needed for moderate pain (pain score 4-6) or severe pain (pain score 7-10). (Patient not taking: Reported on 06/03/2024), Disp: 40 tablet, Rfl: 0   oxyCODONE  (ROXICODONE ) 5 MG immediate release tablet, Take 1-2 tablets (5-10 mg total) by mouth every 4 (four) hours as needed for moderate pain (pain score 4-6) or severe pain (pain score 7-10)., Disp: 40 tablet, Rfl: 0 No Known Allergies   ROS: A complete ROS was performed with pertinent positives/negatives noted in the HPI. The remainder of the ROS are negative.    Objective:   Today's Vitals   06/03/24 1547  BP: 130/80  Pulse: 82  Temp: 98.8 F (37.1 C)  TempSrc: Temporal  SpO2: 98%  Weight: (!) 308 lb 9.6 oz (140 kg)  Height: 6' 4.5 (1.943 m)    GENERAL: Well-appearing, in NAD. Well nourished.  SKIN: Pink, warm and dry.  RESPIRATORY: Chest wall symmetrical. Respirations even and non-labored. PSYCH/MENTAL STATUS: Alert, oriented x 3. Cooperative, appropriate mood and affect.     Assessment & Plan:  1. Screening for STD (sexually transmitted disease) (Primary) - Urine cytology ancillary only - RPR - HIV antibody (with reflex) - Hepatitis C antibody  Orders Placed This Encounter  Procedures   RPR   HIV antibody (with reflex)   Hepatitis C antibody   Lab Orders  RPR         HIV antibody (with reflex)         Hepatitis C antibody     No images are attached to the encounter or orders placed in the encounter.  No follow-ups on file.   Rosina Senters, FNP

## 2024-06-04 LAB — HEPATITIS C ANTIBODY: Hepatitis C Ab: NONREACTIVE

## 2024-06-04 LAB — HIV ANTIBODY (ROUTINE TESTING W REFLEX): HIV 1&2 Ab, 4th Generation: NONREACTIVE

## 2024-06-04 LAB — RPR: RPR Ser Ql: NONREACTIVE

## 2024-06-08 ENCOUNTER — Ambulatory Visit: Payer: Self-pay | Admitting: Internal Medicine

## 2024-06-10 DIAGNOSIS — H52223 Regular astigmatism, bilateral: Secondary | ICD-10-CM | POA: Diagnosis not present

## 2024-06-10 DIAGNOSIS — H5213 Myopia, bilateral: Secondary | ICD-10-CM | POA: Diagnosis not present

## 2024-07-14 ENCOUNTER — Encounter: Payer: Self-pay | Admitting: Internal Medicine

## 2024-07-14 ENCOUNTER — Ambulatory Visit (INDEPENDENT_AMBULATORY_CARE_PROVIDER_SITE_OTHER): Admitting: Internal Medicine

## 2024-07-14 VITALS — BP 130/80 | HR 67 | Temp 98.8°F | Ht 76.5 in | Wt 303.2 lb

## 2024-07-14 DIAGNOSIS — M25562 Pain in left knee: Secondary | ICD-10-CM

## 2024-07-14 DIAGNOSIS — M25561 Pain in right knee: Secondary | ICD-10-CM | POA: Diagnosis not present

## 2024-07-14 DIAGNOSIS — G8929 Other chronic pain: Secondary | ICD-10-CM | POA: Diagnosis not present

## 2024-07-14 DIAGNOSIS — E669 Obesity, unspecified: Secondary | ICD-10-CM | POA: Insufficient documentation

## 2024-07-14 DIAGNOSIS — Z0000 Encounter for general adult medical examination without abnormal findings: Secondary | ICD-10-CM | POA: Diagnosis not present

## 2024-07-14 NOTE — Progress Notes (Signed)
 Subjective:   Nicholas Jensen December 26, 1995 07/14/2024  CC: Chief Complaint  Patient presents with   Annual Exam    HPI: Nicholas Jensen is a 28 y.o. male who presents for a routine health maintenance exam.  Labs collected at time of visit.   Patient reports he would like some guidance for weight loss.  He reports having trouble with overeating when he sits down to eat a meal.  He reports for exercise he gets about 12-13,000 steps in per day.  He also complains of bilateral chronic knee pain.  No recent injuries.  He reports with the recent weight gain he has noticed pain has steadily increased.  He reports popping sensation when bending his knees.  HEALTH SCREENINGS: - PSA (50+): Not applicable  No results found for: PSA1, PSA   - Colonoscopy (45+): Not applicable  - AAA Screening: Not applicable  Men age 14-75 who have ever smoked - Lung Cancer screening with low-dose CT: Not applicable-  Adults age 37-80 who are current cigarette smokers or quit within the last 15 years. Must have 20 pack year history.   IMMUNIZATIONS:  - Tdap: Tetanus vaccination status reviewed: last tetanus booster within 10 years. - Influenza: declined today - Prevnar: Not applicable - Zostavax vaccine (50+): Not applicable   Past medical history, surgical history, medications, allergies, family history and social history reviewed with patient today and changes made to appropriate areas of the chart.   Social History   Socioeconomic History   Marital status: Single    Spouse name: Not on file   Number of children: Not on file   Years of education: Not on file   Highest education level: Not on file  Occupational History   Not on file  Tobacco Use   Smoking status: Never   Smokeless tobacco: Never  Vaping Use   Vaping status: Former  Substance and Sexual Activity   Alcohol use: Yes    Alcohol/week: 4.0 standard drinks of alcohol    Types: 4 Standard drinks or equivalent per week     Comment: WEEKLY ON WEEKENDS   Drug use: Not Currently    Types: Marijuana   Sexual activity: Yes    Partners: Female    Birth control/protection: None    Comment: 2 - 3 per week   Other Topics Concern   Not on file  Social History Narrative   Not on file   Social Drivers of Health   Financial Resource Strain: Medium Risk (06/02/2024)   Overall Financial Resource Strain (CARDIA)    Difficulty of Paying Living Expenses: Somewhat hard  Food Insecurity: No Food Insecurity (06/02/2024)   Hunger Vital Sign    Worried About Running Out of Food in the Last Year: Never true    Ran Out of Food in the Last Year: Never true  Transportation Needs: No Transportation Needs (06/02/2024)   PRAPARE - Administrator, Civil Service (Medical): No    Lack of Transportation (Non-Medical): No  Physical Activity: Insufficiently Active (06/02/2024)   Exercise Vital Sign    Days of Exercise per Week: 3 days    Minutes of Exercise per Session: 20 min  Stress: No Stress Concern Present (06/02/2024)   [employer] of Occupational Health - Occupational Stress Questionnaire    Feeling of Stress: Not at all  Social Connections: Unknown (06/02/2024)   Social Connection and Isolation Panel    Frequency of Communication with Friends and Family: Three times a week  Frequency of Social Gatherings with Friends and Family: Twice a week    Attends Religious Services: Patient declined    Active Member of Clubs or Organizations: No    Attends Engineer, structural: Not on file    Marital Status: Patient declined  Intimate Partner Violence: Not At Risk (02/16/2021)   Humiliation, Afraid, Rape, and Kick questionnaire    Fear of Current or Ex-Partner: No    Emotionally Abused: No    Physically Abused: No    Sexually Abused: No    Past Medical History:  Diagnosis Date   Closed fracture of metacarpal bone 01/17/2010   Fracture Of The Metacarpal Bones       10/1 IMO update     Complication of  anesthesia    WOKE UP DURING DENTAL PROCEDURE WHEN 28 YEARS OLD   Headache    MIGRAINES   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       10/1 IMO update     Left ankle pain 02/13/2017   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   Pneumonia    Right shoulder injury 05/15/2012   Rotator cuff tendinitis, right 2025   Traumatic complete tear of right rotator cuff, subsequent encounter     Past Surgical History:  Procedure Laterality Date   HAND SURGERY     right   HAND SURGERY     OTHER SURGICAL HISTORY     Vocal cord lesion excision   SHOULDER ARTHROSCOPY WITH SUBACROMIAL DECOMPRESSION, ROTATOR CUFF REPAIR AND BICEP TENDON REPAIR Right 01/20/2024   Procedure: RIGHT SHOULDER ARTHROSCOPY WITH DEBRIDEMENT, DECOMPRESSION, AND ROTATOR CUFF REPAIR.;  Surgeon: Edie Norleen PARAS, MD;  Location: [facility] OR[facility];  Service: Orthopedics;  Laterality: Right;   wisdom teeth excision      No current outpatient medications on file prior to visit.   No current facility-administered medications on file prior to visit.    No Known Allergies  History reviewed. No pertinent family history.   ROS: Denies fever, fatigue, unexplained weight loss/gain, hearing or vision changes, cardiac or respiratory complaints. Denies neurological deficits, musculoskeletal complaints, gastrointestinal or genitourinary complaints, mental health complaints, and skin changes.   Objective:   Today's Vitals   07/14/24 1529  BP: 130/80  Pulse: 67  Temp: 98.8 F (37.1 C)  TempSrc: Temporal  SpO2: 98%  Weight: (!) 303 lb 3.2 oz (137.5 kg)  Height: 6' 4.5 (1.943 m)    GENERAL APPEARANCE: Well-appearing, in NAD. Well nourished.  SKIN: Pink, warm and dry. Turgor normal. No rash, lesion, ulceration, or ecchymoses. Hair evenly distributed.  HEENT: HEAD: Normocephalic.  EYES: PERRLA. EOMI. Lids intact w/o defect. Sclera white, Conjunctiva pink w/o exudate.  EARS: External ear w/o redness, swelling,  masses or lesions. EAC clear. TM's intact, translucent w/o bulging, appropriate landmarks visualized. Appropriate acuity to conversational tones.  NOSE: Septum midline w/o deformity. Nares patent, mucosa pink and non-inflamed w/o drainage.  THROAT: Uvula midline. Oropharynx clear. Tonsils non-inflamed w/o exudate. Oral mucosa pink and moist.  NECK: Supple, Trachea midline. Full ROM w/o pain or tenderness. No lymphadenopathy. Thyroid non-tender w/o enlargement or palpable masses.  RESPIRATORY: Chest wall symmetrical w/o masses. Respirations even and non-labored. Breath sounds clear to auscultation bilaterally. No wheezes, rales, rhonchi, or crackles. CARDIAC: S1, S2 present, regular rate and rhythm. No gallops, murmurs, rubs, or clicks. No carotid bruits. Capillary refill <2 seconds. Peripheral pulses 2+ bilaterally. GI: Abdomen soft w/o distention. Normoactive bowel sounds. No palpable masses or  tenderness. No guarding or rebound tenderness. Liver and spleen w/o tenderness or enlargement. No CVA tenderness.  GU:  deferred exam. MSK: Muscle tone and strength appropriate for age, w/o atrophy or abnormal movement. EXTREMITIES: Active ROM intact, w/o contracture. No obvious joint deformities or effusions. No clubbing, edema, or cyanosis.  NEUROLOGIC: CN's II-XII intact. Motor strength symmetrical with no obvious weakness. No sensory deficits.  Steady, even gait.  PSYCH/MENTAL STATUS: Alert, oriented x 3. Cooperative, appropriate mood and affect.    Depression and Anxiety Screen done today and results listed below:     07/14/2024    3:31 PM 06/03/2024    3:46 PM 07/10/2023    3:39 PM 06/19/2023    1:49 PM 02/16/2021   10:15 AM  Depression screen PHQ 2/9  Decreased Interest 0 0 0 0   Down, Depressed, Hopeless 0 0 0 0   PHQ - 2 Score 0 0 0 0   Altered sleeping 1  0 0   Tired, decreased energy 0  0 0   Change in appetite 1  0 0   Feeling bad or failure about yourself  0  0 0   Trouble concentrating 0   0 1   Moving slowly or fidgety/restless 0  0 0   Suicidal thoughts 0  0 0   PHQ-9 Score 2  0 1   Difficult doing work/chores Not difficult at all  Not difficult at all Not difficult at all      Information is confidential and restricted. Go to Review Flowsheets to unlock data.      07/14/2024    3:32 PM 07/10/2023    3:40 PM 06/19/2023    1:49 PM  GAD 7 : Generalized Anxiety Score  Nervous, Anxious, on Edge 0 0 0  Control/stop worrying 0 0 0  Worry too much - different things 0 0 0  Trouble relaxing 0 0 0  Restless 0 0 0  Easily annoyed or irritable 0 0 0  Afraid - awful might happen 0 0 0  Total GAD 7 Score 0 0 0  Anxiety Difficulty Not difficult at all Not difficult at all Not difficult at all    Assessment & Plan:  1. Annual physical exam (Primary) - CBC with Differential/Platelet - Comprehensive metabolic panel with GFR - Lipid panel - TSH  2. Obesity (BMI 30-39.9) - Discussed incorporating high-protein foods into his diet (lean meats, cottage cheese, yogurt, nuts, etc.)  - Advised patient to decrease the speed at which he intakes his food, allowing time for food to digest to help prevent overeating - Advised incorporating cardio exercise and weight training at least 30 minutes a day  3. Chronic pain of both knees -Offered referral to orthopedics, patient declined at this time.  He will work on weight loss and see if knee pain improves.  If no improvement with weight loss, he will notify provider for referral to be placed.  Orders Placed This Encounter  Procedures   CBC with Differential/Platelet   Comprehensive metabolic panel with GFR   Lipid panel   TSH    PATIENT COUNSELING: - Encouraged to adjust caloric intake to maintain or achieve ideal body weight, to reduce intake of dietary saturated fat and total fat, to limit sodium intake by avoiding high sodium foods and not adding table salt, and to maintain adequate dietary potassium and calcium preferably from fresh  fruits, vegetables, and low-fat dairy products.   - Advised to avoid cigarette smoking. - Discussed with  the patient that most people either abstain from alcohol or drink within safe limits (<=14/week and <=4 drinks/occasion for males, <=7/weeks and <= 3 drinks/occasion for females) and that the risk for alcohol disorders and other health effects rises proportionally with the number of drinks per week and how often a drinker exceeds daily limits. - Discussed cessation/primary prevention of drug use and availability of treatment for abuse.   - Stressed the importance of regular exercise - Sexuality: Discussed sexually transmitted diseases, partner selection, use of condoms, avoidance of unintended pregnancy  and contraceptive alternatives.   NEXT PREVENTATIVE PHYSICAL DUE IN 1 YEAR.  Return in about 1 year (around 07/14/2025) for Annual Physical Exam with fasting lab work.  Rosina Senters, FNP

## 2024-07-15 LAB — COMPREHENSIVE METABOLIC PANEL WITH GFR
ALT: 36 U/L (ref 0–53)
AST: 23 U/L (ref 0–37)
Albumin: 4.5 g/dL (ref 3.5–5.2)
Alkaline Phosphatase: 53 U/L (ref 39–117)
BUN: 15 mg/dL (ref 6–23)
CO2: 26 meq/L (ref 19–32)
Calcium: 9.2 mg/dL (ref 8.4–10.5)
Chloride: 104 meq/L (ref 96–112)
Creatinine, Ser: 1.01 mg/dL (ref 0.40–1.50)
GFR: 101.36 mL/min (ref >60.00)
Glucose, Bld: 88 mg/dL (ref 70–99)
Potassium: 3.9 meq/L (ref 3.5–5.1)
Sodium: 138 meq/L (ref 135–145)
Total Bilirubin: 0.7 mg/dL (ref 0.2–1.2)
Total Protein: 7.3 g/dL (ref 6.0–8.3)

## 2024-07-15 LAB — CBC WITH DIFFERENTIAL/PLATELET
Basophils Absolute: 0 K/uL (ref 0.0–0.1)
Basophils Relative: 0.9 % (ref 0.0–3.0)
Eosinophils Absolute: 0 K/uL (ref 0.0–0.7)
Eosinophils Relative: 1 % (ref 0.0–5.0)
HCT: 41.7 % (ref 39.0–52.0)
Hemoglobin: 14 g/dL (ref 13.0–17.0)
Lymphocytes Relative: 49.9 % — ABNORMAL HIGH (ref 12.0–46.0)
Lymphs Abs: 2.1 K/uL (ref 0.7–4.0)
MCHC: 33.7 g/dL (ref 30.0–36.0)
MCV: 85.4 fl (ref 78.0–100.0)
Monocytes Absolute: 0.3 K/uL (ref 0.1–1.0)
Monocytes Relative: 6.2 % (ref 3.0–12.0)
Neutro Abs: 1.8 K/uL (ref 1.4–7.7)
Neutrophils Relative %: 42 % — ABNORMAL LOW (ref 43.0–77.0)
Platelets: 239 K/uL (ref 150.0–400.0)
RBC: 4.88 Mil/uL (ref 4.22–5.81)
RDW: 13.8 % (ref 11.5–15.5)
WBC: 4.3 K/uL (ref 4.0–10.5)

## 2024-07-15 LAB — TSH: TSH: 0.64 u[IU]/mL (ref 0.35–5.50)

## 2024-07-15 LAB — LIPID PANEL
Cholesterol: 206 mg/dL — ABNORMAL HIGH (ref 0–200)
HDL: 37.8 mg/dL — ABNORMAL LOW (ref >39.00)
LDL Cholesterol: 144 mg/dL — ABNORMAL HIGH (ref 0–99)
NonHDL: 168.64
Total CHOL/HDL Ratio: 5
Triglycerides: 121 mg/dL (ref 0.0–149.0)
VLDL: 24.2 mg/dL (ref 0.0–40.0)

## 2024-07-21 ENCOUNTER — Ambulatory Visit: Payer: Self-pay | Admitting: Internal Medicine

## 2024-07-28 DIAGNOSIS — M7581 Other shoulder lesions, right shoulder: Secondary | ICD-10-CM | POA: Diagnosis not present

## 2024-07-28 DIAGNOSIS — M25511 Pain in right shoulder: Secondary | ICD-10-CM | POA: Diagnosis not present

## 2024-07-28 DIAGNOSIS — S46011D Strain of muscle(s) and tendon(s) of the rotator cuff of right shoulder, subsequent encounter: Secondary | ICD-10-CM | POA: Diagnosis not present

## 2024-07-28 DIAGNOSIS — Z9889 Other specified postprocedural states: Secondary | ICD-10-CM | POA: Diagnosis not present
